# Patient Record
Sex: Female | Born: 1975 | Race: White | Hispanic: No | Smoking: Former smoker
Health system: Southern US, Community
[De-identification: ages and names within clinical notes are randomized; demographics above are authoritative.]

## PROBLEM LIST (undated history)

## (undated) ENCOUNTER — Inpatient Hospital Stay: Payer: Self-pay

## (undated) DIAGNOSIS — M1990 Unspecified osteoarthritis, unspecified site: Secondary | ICD-10-CM

## (undated) DIAGNOSIS — M5136 Other intervertebral disc degeneration, lumbar region: Secondary | ICD-10-CM

## (undated) DIAGNOSIS — E039 Hypothyroidism, unspecified: Secondary | ICD-10-CM

## (undated) DIAGNOSIS — D649 Anemia, unspecified: Secondary | ICD-10-CM

## (undated) DIAGNOSIS — L309 Dermatitis, unspecified: Secondary | ICD-10-CM

## (undated) DIAGNOSIS — M51369 Other intervertebral disc degeneration, lumbar region without mention of lumbar back pain or lower extremity pain: Secondary | ICD-10-CM

## (undated) DIAGNOSIS — M5126 Other intervertebral disc displacement, lumbar region: Secondary | ICD-10-CM

## (undated) DIAGNOSIS — S0990XA Unspecified injury of head, initial encounter: Secondary | ICD-10-CM

## (undated) DIAGNOSIS — M109 Gout, unspecified: Secondary | ICD-10-CM

## (undated) DIAGNOSIS — M069 Rheumatoid arthritis, unspecified: Secondary | ICD-10-CM

## (undated) DIAGNOSIS — Z8781 Personal history of (healed) traumatic fracture: Secondary | ICD-10-CM

## (undated) DIAGNOSIS — F1911 Other psychoactive substance abuse, in remission: Secondary | ICD-10-CM

## (undated) HISTORY — DX: Other intervertebral disc displacement, lumbar region: M51.26

## (undated) HISTORY — DX: Gout, unspecified: M10.9

## (undated) HISTORY — DX: Other intervertebral disc degeneration, lumbar region: M51.36

## (undated) HISTORY — DX: Other psychoactive substance abuse, in remission: F19.11

## (undated) HISTORY — DX: Rheumatoid arthritis, unspecified: M06.9

## (undated) HISTORY — DX: Dermatitis, unspecified: L30.9

## (undated) HISTORY — DX: Hypothyroidism, unspecified: E03.9

## (undated) HISTORY — PX: PELVIC LAPAROSCOPY: SHX162

## (undated) HISTORY — PX: CHOLECYSTECTOMY: SHX55

## (undated) HISTORY — DX: Anemia, unspecified: D64.9

## (undated) HISTORY — DX: Unspecified osteoarthritis, unspecified site: M19.90

## (undated) HISTORY — PX: COSMETIC SURGERY: SHX468

## (undated) HISTORY — DX: Unspecified injury of head, initial encounter: S09.90XA

## (undated) HISTORY — DX: Personal history of (healed) traumatic fracture: Z87.81

## (undated) HISTORY — DX: Other intervertebral disc degeneration, lumbar region without mention of lumbar back pain or lower extremity pain: M51.369

---

## 2005-11-07 ENCOUNTER — Observation Stay: Payer: Self-pay | Admitting: Obstetrics and Gynecology

## 2005-12-24 ENCOUNTER — Observation Stay: Payer: Self-pay | Admitting: Obstetrics and Gynecology

## 2005-12-29 ENCOUNTER — Inpatient Hospital Stay: Payer: Self-pay | Admitting: Obstetrics and Gynecology

## 2006-06-23 ENCOUNTER — Emergency Department: Payer: Self-pay | Admitting: Emergency Medicine

## 2006-06-30 ENCOUNTER — Ambulatory Visit: Payer: Self-pay | Admitting: Obstetrics and Gynecology

## 2006-07-07 ENCOUNTER — Ambulatory Visit: Payer: Self-pay | Admitting: Obstetrics and Gynecology

## 2006-08-04 ENCOUNTER — Encounter: Payer: Self-pay | Admitting: Obstetrics and Gynecology

## 2006-08-19 ENCOUNTER — Encounter: Payer: Self-pay | Admitting: Obstetrics and Gynecology

## 2009-03-30 ENCOUNTER — Emergency Department: Payer: Self-pay | Admitting: Emergency Medicine

## 2010-09-16 ENCOUNTER — Emergency Department: Payer: Self-pay | Admitting: Emergency Medicine

## 2012-02-14 ENCOUNTER — Ambulatory Visit: Payer: Self-pay | Admitting: Internal Medicine

## 2013-03-29 ENCOUNTER — Ambulatory Visit: Payer: Self-pay | Admitting: Physical Medicine and Rehabilitation

## 2014-02-27 ENCOUNTER — Emergency Department: Payer: Self-pay | Admitting: Emergency Medicine

## 2014-02-27 LAB — COMPREHENSIVE METABOLIC PANEL
ALK PHOS: 86 U/L
ALT: 27 U/L
Albumin: 3.4 g/dL (ref 3.4–5.0)
Anion Gap: 10 (ref 7–16)
BILIRUBIN TOTAL: 1 mg/dL (ref 0.2–1.0)
BUN: 12 mg/dL (ref 7–18)
Calcium, Total: 9 mg/dL (ref 8.5–10.1)
Chloride: 102 mmol/L (ref 98–107)
Co2: 22 mmol/L (ref 21–32)
Creatinine: 0.81 mg/dL (ref 0.60–1.30)
EGFR (African American): >60
GLUCOSE: 180 mg/dL — AB (ref 65–99)
Osmolality: 273 (ref 275–301)
Potassium: 4 mmol/L (ref 3.5–5.1)
SGOT(AST): 51 U/L — ABNORMAL HIGH (ref 15–37)
SODIUM: 134 mmol/L — AB (ref 136–145)
Total Protein: 8.1 g/dL (ref 6.4–8.2)

## 2014-02-27 LAB — CBC WITH DIFFERENTIAL/PLATELET
BASOS ABS: 0 10*3/uL (ref 0.0–0.1)
Basophil %: 0.2 %
Eosinophil #: 0 10*3/uL (ref 0.0–0.7)
Eosinophil %: 0.6 %
HCT: 39.3 % (ref 35.0–47.0)
HGB: 13 g/dL (ref 12.0–16.0)
LYMPHS ABS: 0.9 10*3/uL — AB (ref 1.0–3.6)
Lymphocyte %: 14.8 %
MCH: 28.3 pg (ref 26.0–34.0)
MCHC: 33.1 g/dL (ref 32.0–36.0)
MCV: 86 fL (ref 80–100)
MONO ABS: 0.3 x10 3/mm (ref 0.2–0.9)
Monocyte %: 5.7 %
NEUTROS PCT: 78.7 %
Neutrophil #: 4.7 10*3/uL (ref 1.4–6.5)
Platelet: 282 10*3/uL (ref 150–440)
RBC: 4.59 10*6/uL (ref 3.80–5.20)
RDW: 15.5 % — ABNORMAL HIGH (ref 11.5–14.5)
WBC: 5.9 10*3/uL (ref 3.6–11.0)

## 2014-02-27 LAB — DRUG SCREEN, URINE
Amphetamines, Ur Screen: POSITIVE (ref ?–1000)
BARBITURATES, UR SCREEN: NEGATIVE (ref ?–200)
BENZODIAZEPINE, UR SCRN: NEGATIVE (ref ?–200)
COCAINE METABOLITE, UR ~~LOC~~: POSITIVE (ref ?–300)
Cannabinoid 50 Ng, Ur ~~LOC~~: POSITIVE (ref ?–50)
MDMA (ECSTASY) UR SCREEN: NEGATIVE (ref ?–500)
Methadone, Ur Screen: NEGATIVE (ref ?–300)
OPIATE, UR SCREEN: POSITIVE (ref ?–300)
PHENCYCLIDINE (PCP) UR S: NEGATIVE (ref ?–25)
Tricyclic, Ur Screen: NEGATIVE (ref ?–1000)

## 2014-02-27 LAB — RAPID HIV SCREEN (HIV 1/2 AB+AG)

## 2015-06-21 NOTE — L&D Delivery Note (Signed)
VAGINAL DELIVERY NOTE:  Date of Delivery: 09/29/2015 Primary OB:[facility] Gestational Age/EDD: [redacted]w[redacted]d 09/25/2015 Antepartum complications: Poor attendance at maternal visits (missed multiple visits) Attending Physician: Hal Neer, CNM Delivery Type: NSVD of viable female  Anesthesia: None Laceration: None Episiotomy:None Placenta: mec stained amniotic fluid Intrapartum complications: PP hemorrhage 500 mls'  Estimated Blood Loss: 500 ml's  GBS:neg Procedure Details: CTSP due to being complete and waited for FOB Misty Stanley) as pt was feeling urge to push. Misty Stanley called and arrived quickly and pt pushed x 3 with FHR to 70's x 2 mins and mec noted (thick non-particulate mec brown) noted. Vtx del with CAN  X 1 reduced and ant and post shoulder and body del at 0935pm. CCx2 and cut after pulsation stopped. Cord blood not needed as pt is A pos. SDOP intact and no lacs seen. Called back to room within 5 mins for a pp hemorrhage. Pitocin in IV  Bag running wide open. Cytotec 800 mg per rectum placed. Bleeding controlled. VSS. Sponge and needle count correct. Bonding with baby skin to skin.    APGAR: 8, 9; weight  .    Sharee Pimple 09/29/2015, 10:43 PM

## 2015-07-17 LAB — HM PAP SMEAR

## 2015-07-23 ENCOUNTER — Ambulatory Visit: Payer: Self-pay

## 2015-08-11 LAB — HM HIV SCREENING LAB: HM HIV Screening: NEGATIVE

## 2015-09-29 ENCOUNTER — Inpatient Hospital Stay
Admission: EM | Admit: 2015-09-29 | Discharge: 2015-10-01 | DRG: 774 | Disposition: A | Discharge status: Home / Self Care | Payer: BLUE CROSS/BLUE SHIELD | Attending: Obstetrics and Gynecology | Admitting: Obstetrics and Gynecology

## 2015-09-29 ENCOUNTER — Encounter: Payer: Self-pay | Admitting: *Deleted

## 2015-09-29 DIAGNOSIS — Z23 Encounter for immunization: Secondary | ICD-10-CM | POA: Diagnosis not present

## 2015-09-29 DIAGNOSIS — Z3A40 40 weeks gestation of pregnancy: Secondary | ICD-10-CM | POA: Diagnosis not present

## 2015-09-29 DIAGNOSIS — Z87891 Personal history of nicotine dependence: Secondary | ICD-10-CM

## 2015-09-29 HISTORY — DX: Unspecified osteoarthritis, unspecified site: M19.90

## 2015-09-29 LAB — URINE DRUG SCREEN, QUALITATIVE (ARMC ONLY)
AMPHETAMINES, UR SCREEN: NOT DETECTED
BENZODIAZEPINE, UR SCRN: NOT DETECTED
Barbiturates, Ur Screen: NOT DETECTED
Cannabinoid 50 Ng, Ur ~~LOC~~: POSITIVE — AB
Cocaine Metabolite,Ur ~~LOC~~: POSITIVE — AB
MDMA (Ecstasy)Ur Screen: NOT DETECTED
METHADONE SCREEN, URINE: NOT DETECTED
OPIATE, UR SCREEN: NOT DETECTED
Phencyclidine (PCP) Ur S: NOT DETECTED
Tricyclic, Ur Screen: NOT DETECTED

## 2015-09-29 LAB — CBC
HEMATOCRIT: 37.1 % (ref 35.0–47.0)
HEMOGLOBIN: 12.9 g/dL (ref 12.0–16.0)
MCH: 29.2 pg (ref 26.0–34.0)
MCHC: 34.8 g/dL (ref 32.0–36.0)
MCV: 83.9 fL (ref 80.0–100.0)
Platelets: 159 10*3/uL (ref 150–440)
RBC: 4.42 MIL/uL (ref 3.80–5.20)
RDW: 13.5 % (ref 11.5–14.5)
WBC: 10.6 10*3/uL (ref 3.6–11.0)

## 2015-09-29 LAB — TYPE AND SCREEN
ABO/RH(D): A POS
Antibody Screen: NEGATIVE

## 2015-09-29 MED ORDER — BUTORPHANOL TARTRATE 1 MG/ML IJ SOLN: 2.0000 mg | Freq: Once | INTRAMUSCULAR | Status: DC

## 2015-09-29 MED ORDER — OXYTOCIN 40 UNITS IN LACTATED RINGERS INFUSION - SIMPLE MED
2.5000 [IU]/h | INTRAVENOUS | Status: DC
  Administered 2015-09-29: 20 [IU]/h via INTRAVENOUS
  Filled 2015-09-29: qty 1000

## 2015-09-29 MED ORDER — BUTORPHANOL TARTRATE 1 MG/ML IJ SOLN
1.0000 mg | Freq: Once | INTRAMUSCULAR | Status: AC
  Administered 2015-09-29: 1 mg via INTRAVENOUS
  Administered 2015-09-29: 2 mg via INTRAVENOUS
  Filled 2015-09-29: qty 1

## 2015-09-29 MED ORDER — CITRIC ACID-SODIUM CITRATE 334-500 MG/5ML PO SOLN: 30.0000 mL | ORAL | Status: DC | PRN

## 2015-09-29 MED ORDER — ACETAMINOPHEN 325 MG PO TABS: 650.0000 mg | ORAL_TABLET | ORAL | Status: DC | PRN

## 2015-09-29 MED ORDER — LIDOCAINE HCL (PF) 1 % IJ SOLN
30.0000 mL | INTRAMUSCULAR | Status: DC | PRN
  Filled 2015-09-29 (×2): qty 30

## 2015-09-29 MED ORDER — MISOPROSTOL 200 MCG PO TABS
ORAL_TABLET | ORAL | Status: AC
  Administered 2015-09-29: 800 ug via RECTAL
  Filled 2015-09-29: qty 4

## 2015-09-29 MED ORDER — BUTORPHANOL TARTRATE 1 MG/ML IJ SOLN: 1.0000 mg | INTRAMUSCULAR | Status: DC | PRN

## 2015-09-29 MED ORDER — ONDANSETRON HCL 4 MG/2ML IJ SOLN: 4.0000 mg | Freq: Four times a day (QID) | INTRAMUSCULAR | Status: DC | PRN

## 2015-09-29 MED ORDER — LACTATED RINGERS IV SOLN
INTRAVENOUS | Status: DC
  Administered 2015-09-29: 18:00:00 via INTRAVENOUS

## 2015-09-29 MED ORDER — OXYTOCIN 10 UNIT/ML IJ SOLN
INTRAMUSCULAR | Status: AC
  Filled 2015-09-29: qty 4

## 2015-09-29 MED ORDER — BUTORPHANOL TARTRATE 1 MG/ML IJ SOLN
INTRAMUSCULAR | Status: AC
  Administered 2015-09-29: 2 mg via INTRAVENOUS
  Filled 2015-09-29: qty 2

## 2015-09-29 MED ORDER — LACTATED RINGERS IV SOLN
500.0000 mL | INTRAVENOUS | Status: DC | PRN
  Administered 2015-09-29: 1000 mL via INTRAVENOUS

## 2015-09-29 MED ORDER — AMMONIA AROMATIC IN INHA
RESPIRATORY_TRACT | Status: AC
  Filled 2015-09-29: qty 10

## 2015-09-29 MED ORDER — OXYTOCIN BOLUS FROM INFUSION: 500.0000 mL | INTRAVENOUS | Status: DC

## 2015-09-29 NOTE — Progress Notes (Signed)
S:Pt is writhing in pain. + CTX, no LOF, + scant VB O: Filed Vitals:   09/29/15 1701 09/29/15 1844 09/29/15 1924 09/29/15 1949  BP: 124/67   142/78  Pulse: 94   76  Temp: 98.4 F (36.9 C)  97.8 F (36.6 C)   TempSrc: Oral  Oral   Resp: 22   18  Height:  5\' 9"  (1.753 m)    Weight:  197 lb (89.359 kg)      Gen: NAD, AAOx3      Abd: FNTTP      Ext: Non-tender, Nonedmeatous    FHT: + mod var + accelerations, variable decelerations to 80-90 with 10-15 secs with recovery, Cat 2 TOCO: Q 3-4  min SVE: 6/100/vtx   A/P:  40 y.o. yo G1P0 at [redacted]w[redacted]d for labor.   Labor: active   FWB: Reassuring Cat 2  tracing. EFW 6#1oz  GBS: neg    [redacted]w[redacted]d 8:39 PM

## 2015-09-29 NOTE — H&P (Signed)
HISTORY AND PHYSICAL  HISTORY OF PRESENT ILLNESS: Ms. Kathy Walton is a 40 y.o. [phone] (1 ectopic, 2 SAB) and at [redacted]w[redacted]d by LMP of 12/19/14 & EDD of 09/25/15  consistent with  Anatomy  ultrasound with a pregnancy complicated by previous presenting for labor symptoms.   She has been having contractions Q 5 minsand denies leakage of fluid, vaginal bleeding, or decreased fetal movement.    REVIEW OF SYSTEMS: A complete review of systems was performed and was specifically negative for headache, changes in vision, RUQ pain, shortness of breath, chest pain, lower extremity edema and dysuria.   HISTORY:  Past Medical History  Diagnosis Date  . Arthritis   Substance Abuse in the past labs 2015  Past Surgical History  Procedure Laterality Date  . Cholecystectomy    Ectopic pregnancy  No current facility-administered medications on file prior to encounter.   No current outpatient prescriptions on file prior to encounter.     Not on File  OB History  Gravida Para Term Preterm AB SAB TAB Ectopic Multiple Living  1             # Outcome Date GA Lbr Len/2nd Weight Sex Delivery Anes PTL Lv  1 Current               Gynecologic History: History of Abnormal Pap Smear: unknown History of STI: Trich  Social History  Substance Use Topics  . Smoking status: Former Smoker    Types: Cigarettes    Quit date: 09/01/2015  . Smokeless tobacco: None  . Alcohol Use: No    PHYSICAL EXAM:    GENERAL: NAD AAOx3 CHEST:CTAB no increased work of breathing. CTA bilat, no W/R/R.  CV:RRR no appreciable murmurs, rubs, gallops ABDOMEN: gravid, nontender, EFW 6#1oz by [place] EXTREMITIES:  Warm and well-perfused, nontender, nonedematous, 1+ DTRs 0 clonus CERVIX: 3/90/vtx-1   FHT:s 140 baseline wit minimal variability +accelerations 10 x 10 x 2 and  No  Decelerations (maternal pulse showing)  Toco: irregular on monitor.   DIAGNOSTIC STUDIES: No results for input(s): WBC, HGB, HCT, PLT, NA, K, CL, CO2,  BUN, CREATININE, LABGLOM, GLUCOSE, CALCIUM, BILIDIR, ALKPHOS, AST, ALT, PROT, MG in the last 168 hours.  Invalid input(s): LABALB, UA  PRENATAL STUDIES:  Prenatal Labs:  MBT  pos; Rubella immune, Varicella immune, HIV neg, RPR neg, Hep B neg, GC/CT neg, GBS neg , glucola SNL  Last [place] Could not find but, pt stated she had a normal anatomy US  ASSESSMENT AND PLAN:  1. Fetal Well being  - Fetal Tracing: reactive with 2 10 x 10 accels only  - Ultrasound:could not find in chart - Group B Streptococcus: neg - Presentation vtx confirmed by RN -AMA and pt missed many appts in OB care  2. Routine OB: - Prenatal labs reviewed, as above - Rh  A pos  3.  Labor:  -  Contractionsexternal toco in place -  Pelvis proven to 8#4oz -  Plan: Ext fetal and uterine monitors  4. Post Partum Planning: - Infant feeding:  - Contraception:   5> Substance Abuse hx 7 years drug free but, did admit to Cocaine 4 days ago due to being stressed out.

## 2015-09-29 NOTE — Progress Notes (Signed)
Dr Bernestine Amass texted with update. Will continue to push but, pt is becoming exhausted.    Sharee Pimple 7:50 PM

## 2015-09-30 ENCOUNTER — Encounter: Payer: Self-pay | Admitting: *Deleted

## 2015-09-30 LAB — ABO/RH: ABO/RH(D): A POS

## 2015-09-30 LAB — CBC
HCT: 35.2 % (ref 35.0–47.0)
Hemoglobin: 12.2 g/dL (ref 12.0–16.0)
MCH: 29.1 pg (ref 26.0–34.0)
MCHC: 34.6 g/dL (ref 32.0–36.0)
MCV: 84.1 fL (ref 80.0–100.0)
PLATELETS: 159 10*3/uL (ref 150–440)
RBC: 4.19 MIL/uL (ref 3.80–5.20)
RDW: 13.7 % (ref 11.5–14.5)
WBC: 12.3 10*3/uL — ABNORMAL HIGH (ref 3.6–11.0)

## 2015-09-30 MED ORDER — LACTATED RINGERS IV SOLN: INTRAVENOUS | Status: DC

## 2015-09-30 MED ORDER — DIBUCAINE 1 % RE OINT: 1.0000 "application " | TOPICAL_OINTMENT | RECTAL | Status: DC | PRN

## 2015-09-30 MED ORDER — OXYCODONE HCL 5 MG PO TABS
10.0000 mg | ORAL_TABLET | ORAL | Status: DC | PRN
  Administered 2015-09-30: 10 mg via ORAL
  Filled 2015-09-30: qty 2

## 2015-09-30 MED ORDER — TETANUS-DIPHTH-ACELL PERTUSSIS 5-2.5-18.5 LF-MCG/0.5 IM SUSP: 0.5000 mL | INTRAMUSCULAR | Status: DC | PRN

## 2015-09-30 MED ORDER — LANOLIN HYDROUS EX OINT: TOPICAL_OINTMENT | CUTANEOUS | Status: DC | PRN

## 2015-09-30 MED ORDER — MEASLES, MUMPS & RUBELLA VAC ~~LOC~~ INJ: 0.5000 mL | INJECTION | SUBCUTANEOUS | Status: DC | PRN

## 2015-09-30 MED ORDER — ZOLPIDEM TARTRATE 5 MG PO TABS: 5.0000 mg | ORAL_TABLET | Freq: Every evening | ORAL | Status: DC | PRN

## 2015-09-30 MED ORDER — DIPHENHYDRAMINE HCL 25 MG PO CAPS: 25.0000 mg | ORAL_CAPSULE | Freq: Four times a day (QID) | ORAL | Status: DC | PRN

## 2015-09-30 MED ORDER — LACTATED RINGERS IV SOLN: 500.0000 mL | INTRAVENOUS | Status: DC | PRN

## 2015-09-30 MED ORDER — SIMETHICONE 80 MG PO CHEW: 80.0000 mg | CHEWABLE_TABLET | ORAL | Status: DC | PRN

## 2015-09-30 MED ORDER — IBUPROFEN 600 MG PO TABS
600.0000 mg | ORAL_TABLET | Freq: Four times a day (QID) | ORAL | Status: DC
  Administered 2015-09-30 – 2015-10-01 (×5): 600 mg via ORAL
  Filled 2015-09-30 (×5): qty 1

## 2015-09-30 MED ORDER — SODIUM CHLORIDE 0.9% FLUSH: 3.0000 mL | Freq: Two times a day (BID) | INTRAVENOUS | Status: DC

## 2015-09-30 MED ORDER — ACETAMINOPHEN 325 MG PO TABS: 650.0000 mg | ORAL_TABLET | ORAL | Status: DC | PRN

## 2015-09-30 MED ORDER — BENZOCAINE-MENTHOL 20-0.5 % EX AERO: 1.0000 "application " | INHALATION_SPRAY | CUTANEOUS | Status: DC | PRN

## 2015-09-30 MED ORDER — SODIUM CHLORIDE 0.9% FLUSH: 3.0000 mL | INTRAVENOUS | Status: DC | PRN

## 2015-09-30 MED ORDER — SENNOSIDES-DOCUSATE SODIUM 8.6-50 MG PO TABS
2.0000 | ORAL_TABLET | ORAL | Status: DC
  Administered 2015-09-30 – 2015-10-01 (×2): 2 via ORAL
  Filled 2015-09-30 (×2): qty 2

## 2015-09-30 MED ORDER — BUTORPHANOL TARTRATE 1 MG/ML IJ SOLN: 1.0000 mg | INTRAMUSCULAR | Status: DC | PRN

## 2015-09-30 MED ORDER — OXYCODONE HCL 5 MG PO TABS
5.0000 mg | ORAL_TABLET | ORAL | Status: DC | PRN
  Administered 2015-09-30 – 2015-10-01 (×6): 5 mg via ORAL
  Filled 2015-09-30 (×6): qty 1

## 2015-09-30 MED ORDER — FLEET ENEMA 7-19 GM/118ML RE ENEM: 1.0000 | ENEMA | Freq: Every day | RECTAL | Status: DC | PRN

## 2015-09-30 MED ORDER — SODIUM CHLORIDE 0.9 % IV SOLN: 250.0000 mL | INTRAVENOUS | Status: DC | PRN

## 2015-09-30 MED ORDER — WITCH HAZEL-GLYCERIN EX PADS: 1.0000 "application " | MEDICATED_PAD | CUTANEOUS | Status: DC | PRN

## 2015-09-30 MED ORDER — BISACODYL 10 MG RE SUPP: 10.0000 mg | Freq: Every day | RECTAL | Status: DC | PRN

## 2015-09-30 MED ORDER — ONDANSETRON HCL 4 MG PO TABS: 4.0000 mg | ORAL_TABLET | ORAL | Status: DC | PRN

## 2015-09-30 MED ORDER — LACTATED RINGERS IV SOLN
INTRAVENOUS | Status: DC
  Administered 2015-09-30: 06:00:00 via INTRAVENOUS

## 2015-09-30 MED ORDER — PRENATAL MULTIVITAMIN CH
1.0000 | ORAL_TABLET | Freq: Every day | ORAL | Status: DC
  Administered 2015-09-30: 1 via ORAL
  Filled 2015-09-30: qty 1

## 2015-09-30 MED ORDER — ONDANSETRON HCL 4 MG/2ML IJ SOLN: 4.0000 mg | INTRAMUSCULAR | Status: DC | PRN

## 2015-09-30 NOTE — Discharge Summary (Signed)
Obstetric Discharge Summary   Patient ID: Kathy Walton MRN: [id number] DOB/AGE: 1975/12/04 40 y.o.   Date of Admission: 09/29/2015  Date of Discharge: 10/01/15  Admitting Diagnosis:Term pregnancy at [redacted]w[redacted]d  Secondary Diagnosis:Cocaine Usage   Mode of Delivery: NSVD of viable female Discharge Diagnosis: NSVD with intact perineum  Post partum procedures: None  Complications:None   Brief Hospital Course  Kathy Walton is a G1P1001 who had a SVD on  09/29/15;  for further details of this delivery, please refer to the delivery note.  Patient had an uncomplicated postpartum course.  By time of discharge on PPD#2, her pain was controlled on oral pain medications; she had appropriate lochia and was ambulating, voiding without difficulty and tolerating regular diet.  She was deemed stable for discharge to home.     Labs: CBC Latest Ref Rng 09/29/2015 02/27/2014  WBC 3.6 - 11.0 K/uL 10.6 5.9  Hemoglobin 12.0 - 16.0 g/dL 81.1 03.1  Hematocrit 59.4 - 47.0 % 37.1 39.3  Platelets 150 - 440 K/uL 159 282   A POS  Physical exam:  Blood pressure 149/81, pulse 80, temperature 98.2 F (36.8 C), temperature source Oral, resp. rate 18, height 5\' 9"  (1.753 m), weight 197 lb (89.359 kg), unknown if currently breastfeeding. General: alert and no distress Lochia: appropriate Abdomen: soft, NT Uterine Fundus: firm  Extremities: No evidence of DVT seen on physical exam. No lower extremity edema.  Discharge Instructions: Per After Visit Summary. Activity: Advance as tolerated. Pelvic rest for 6 weeks.  Also refer to After Visit Summary Diet: Regular Medications:   Medication List    ASK your doctor about these medications        multivitamin-prenatal 27-0.8 MG Tabs tablet  Take 1 tablet by mouth daily at 12 noon.       Outpatient follow up:      Follow-up Information    Follow up In 6 weeks.     Postpartum contraception: unsure  Discharged Condition: Stable  Discharged to:  Home   Newborn Data:  Baby Boy   Disposition:Home with mom  Apgars: APGAR (1 MIN): 8   APGAR (5 MINS): 9   APGAR (10 MINS):    Baby Feeding: Bottle  , CNM 09/30/2015

## 2015-09-30 NOTE — Progress Notes (Addendum)
Post Partum Day 1 Subjective: no complaints, up ad lib, voiding and tolerating PO States both shoulders are sore today from pushing Objective: Blood pressure 141/66, pulse 86, temperature 98.8 F (37.1 C), temperature source Oral, resp. rate 20, height 5\' 9"  (1.753 m), weight 197 lb (89.359 kg), SpO2 98 %, unknown if currently breastfeeding.  Physical Exam:  General: alert, cooperative and no distress  HEART: S1S2,RRR, NO M/R/G. LUNGS: CTA bilat, no W/R/R. Lochia: appropriate Uterine Fundus: firm, U-2 Perineum: intact DVT Evaluation: No evidence of DVT seen on physical exam.   Recent Labs  09/29/15 1744 09/30/15 0609  HGB 12.9 12.2  HCT 37.1 35.2  WBC 10.6 12.3*  PLT 159 159    Assessment/Plan: Plan for discharge tomorrow   LOS: 1 day   11/30/15 09/30/2015, 9:45 AM

## 2015-09-30 NOTE — Discharge Instructions (Signed)

## 2015-09-30 NOTE — Clinical Social Work Maternal (Signed)
  CLINICAL SOCIAL WORK MATERNAL/CHILD NOTE  Patient Details  Name: Kathy Walton MRN: [id number] Date of Birth: Nov 06, 1975  Date:  09/30/2015  Clinical Social Worker Initiating Note:  Darden Dates, MSW, LCSW Date/ Time Initiated:  09/30/15/1030     Child's Name:    Kathy Walton (baby boy)  Legal Guardian:  Mother   Need for Interpreter:  None   Date of Referral:  09/30/15     Reason for Referral:  Current Substance Use/Substance Use During Pregnancy    Referral Source:  [facility]   Address:  [address], [place], [place]   Phone number:  [phone]   Household Members:  Self and baby  Natural Supports (not living in the home):  Children, Parent, Spouse/significant other, Extended Family   Professional Supports: Organized support group (Comment) ([place])   Employment: Unemployed   Type of Work: Chartered loss adjuster   Education:  [employer] school Herbalist Resources:  Multimedia programmer   Other Resources:  Other (Comment)   Cultural/Religious Considerations Which May Impact Care: None identified.   Strengths:  Ability to meet basic needs , Home prepared for child , Understanding of illness   Risk Factors/Current Problems:  Substance Use    Cognitive State:  Alert    Mood/Affect:  Calm    CSW Assessment: CSW met with pt to address consult. CSW was consulted due positive drug screen. Pt tested positive for cocaine and THC at delivery. CSW introduced herself and explained role of social work. CSW also explained the nature of the consult. Pt lives alone and is currently not working. Pt reported that she will be taking 6 weeks and then returning to work for the [employer]. Pt is a mother of 3 children, however she does not have custody of her two oldest. Pt reported that her children's father had a "falling out" and she gave custody of her daughter to her father and gave custody of her son to her cousin. This was her choice for  medical reasons as she was incarcerated for 3 months due to a misdemeanor. Pt reported that she has been sober for 7 years, however "lapsed" 4 days ago when she did cocaine and marijuana. Pt shared that she was very stressed out because her 26 year old daughter dropped out of school. Pt shared that she needs to learn to "talk things out". 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 Pt reported that she immediatly called her group due to a "very bad decision." Pt stated that will not "beat herself up over this" and move forward. She is grateful that it has nothing terrible happened. Baby's tox screen is pending, however due to pt being positive for cocaine, a CPS report will be made. Pt denies any previous CPS involvement. CSW informed pt of this, and she is agreeable. CSW provided substance abuse resources. Pt will discharge home at discharge with baby. CSW is signing off as no further needs identified.   CSW Plan/Description:  Child Protective Service Report     Darden Dates, LCSW 09/30/2015, 11:01 AM

## 2015-10-01 LAB — SURGICAL PATHOLOGY

## 2015-10-01 LAB — RPR: RPR Ser Ql: NONREACTIVE

## 2015-10-01 NOTE — Progress Notes (Signed)
Pt discharged home with infant.  Discharge instructions and follow up appointment given to and reviewed with pt.  Pt verbalized understanding.  Escorted by auxillary. 

## 2016-04-11 ENCOUNTER — Other Ambulatory Visit: Payer: Self-pay | Admitting: Family

## 2016-04-11 DIAGNOSIS — M5416 Radiculopathy, lumbar region: Secondary | ICD-10-CM

## 2016-04-21 ENCOUNTER — Ambulatory Visit: Payer: BLUE CROSS/BLUE SHIELD

## 2016-11-01 ENCOUNTER — Ambulatory Visit (INDEPENDENT_AMBULATORY_CARE_PROVIDER_SITE_OTHER): Payer: BLUE CROSS/BLUE SHIELD | Admitting: Physician Assistant

## 2016-11-01 ENCOUNTER — Encounter: Payer: Self-pay | Admitting: Physician Assistant

## 2016-11-01 VITALS — BP 124/80 | HR 100 | Temp 98.4°F | Resp 16 | Ht 68.0 in | Wt 202.0 lb

## 2016-11-01 DIAGNOSIS — R590 Localized enlarged lymph nodes: Secondary | ICD-10-CM | POA: Diagnosis not present

## 2016-11-01 DIAGNOSIS — R569 Unspecified convulsions: Secondary | ICD-10-CM | POA: Diagnosis not present

## 2016-11-01 DIAGNOSIS — F419 Anxiety disorder, unspecified: Secondary | ICD-10-CM

## 2016-11-01 DIAGNOSIS — Z7689 Persons encountering health services in other specified circumstances: Secondary | ICD-10-CM | POA: Diagnosis not present

## 2016-11-01 DIAGNOSIS — F1911 Other psychoactive substance abuse, in remission: Secondary | ICD-10-CM

## 2016-11-01 DIAGNOSIS — Z87898 Personal history of other specified conditions: Secondary | ICD-10-CM | POA: Diagnosis not present

## 2016-11-01 DIAGNOSIS — M25561 Pain in right knee: Secondary | ICD-10-CM | POA: Diagnosis not present

## 2016-11-01 DIAGNOSIS — N949 Unspecified condition associated with female genital organs and menstrual cycle: Secondary | ICD-10-CM | POA: Diagnosis not present

## 2016-11-01 DIAGNOSIS — M25562 Pain in left knee: Secondary | ICD-10-CM

## 2016-11-01 MED ORDER — MELOXICAM 7.5 MG PO TABS: ORAL_TABLET | ORAL | 0 refills | Status: DC
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
## 2016-11-03 ENCOUNTER — Encounter: Payer: Self-pay | Admitting: Physician Assistant

## 2016-11-03 ENCOUNTER — Telehealth: Payer: Self-pay | Admitting: Physician Assistant

## 2016-11-03 DIAGNOSIS — E079 Disorder of thyroid, unspecified: Secondary | ICD-10-CM

## 2016-11-03 DIAGNOSIS — N949 Unspecified condition associated with female genital organs and menstrual cycle: Secondary | ICD-10-CM

## 2016-11-03 DIAGNOSIS — M25561 Pain in right knee: Secondary | ICD-10-CM | POA: Insufficient documentation

## 2016-11-03 DIAGNOSIS — F1911 Other psychoactive substance abuse, in remission: Secondary | ICD-10-CM | POA: Insufficient documentation

## 2016-11-03 DIAGNOSIS — Z13220 Encounter for screening for lipoid disorders: Secondary | ICD-10-CM

## 2016-11-03 DIAGNOSIS — R569 Unspecified convulsions: Secondary | ICD-10-CM | POA: Insufficient documentation

## 2016-11-03 DIAGNOSIS — R590 Localized enlarged lymph nodes: Secondary | ICD-10-CM | POA: Insufficient documentation

## 2016-11-03 DIAGNOSIS — E0789 Other specified disorders of thyroid: Secondary | ICD-10-CM

## 2016-11-03 DIAGNOSIS — M25562 Pain in left knee: Secondary | ICD-10-CM

## 2016-11-03 DIAGNOSIS — F419 Anxiety disorder, unspecified: Secondary | ICD-10-CM | POA: Insufficient documentation

## 2016-11-03 NOTE — Patient Instructions (Signed)
Health Maintenance, Female Adopting a healthy lifestyle and getting preventive care can go a long way to promote health and wellness. Talk with your health care provider about what schedule of regular examinations is right for you. This is a good chance for you to check in with your provider about disease prevention and staying healthy. In between checkups, there are plenty of things you can do on your own. Experts have done a lot of research about which lifestyle changes and preventive measures are most likely to keep you healthy. Ask your health care provider for more information. Weight and diet Eat a healthy diet  Be sure to include plenty of vegetables, fruits, low-fat dairy products, and lean protein.  Do not eat a lot of foods high in solid fats, added sugars, or salt.  Get regular exercise. This is one of the most important things you can do for your health.  Most adults should exercise for at least 150 minutes each week. The exercise should increase your heart rate and make you sweat (moderate-intensity exercise).  Most adults should also do strengthening exercises at least twice a week. This is in addition to the moderate-intensity exercise. Maintain a healthy weight  Body mass index (BMI) is a measurement that can be used to identify possible weight problems. It estimates body fat based on height and weight. Your health care provider can help determine your BMI and help you achieve or maintain a healthy weight.  For females 108 years of age and older:  A BMI below 18.5 is considered underweight.  A BMI of 18.5 to 24.9 is normal.  A BMI of 25 to 29.9 is considered overweight.  A BMI of 30 and above is considered obese. Watch levels of cholesterol and blood lipids  You should start having your blood tested for lipids and cholesterol at 41 years of age, then have this test every 5 years.  You may need to have your cholesterol levels checked more often if:  Your lipid or  cholesterol levels are high.  You are older than 40 years of age.  You are at high risk for heart disease. Cancer screening Lung Cancer  Lung cancer screening is recommended for adults 72-20 years old who are at high risk for lung cancer because of a history of smoking.  A yearly low-dose CT scan of the lungs is recommended for people who:  Currently smoke.  Have quit within the past 15 years.  Have at least a 30-pack-year history of smoking. A pack year is smoking an average of one pack of cigarettes a day for 1 year.  Yearly screening should continue until it has been 15 years since you quit.  Yearly screening should stop if you develop a health problem that would prevent you from having lung cancer treatment. Breast Cancer  Practice breast self-awareness. This means understanding how your breasts normally appear and feel.  It also means doing regular breast self-exams. Let your health care provider know about any changes, no matter how small.  If you are in your 20s or 30s, you should have a clinical breast exam (CBE) by a health care provider every 1-3 years as part of a regular health exam.  If you are 54 or older, have a CBE every year. Also consider having a breast X-ray (mammogram) every year.  If you have a family history of breast cancer, talk to your health care provider about genetic screening.  If you are at high risk for breast cancer, talk  to your health care provider about having an MRI and a mammogram every year.  Breast cancer gene (BRCA) assessment is recommended for women who have family members with BRCA-related cancers. BRCA-related cancers include:  Breast.  Ovarian.  Tubal.  Peritoneal cancers.  Results of the assessment will determine the need for genetic counseling and BRCA1 and BRCA2 testing. Cervical Cancer  Your health care provider may recommend that you be screened regularly for cancer of the pelvic organs (ovaries, uterus, and vagina).  This screening involves a pelvic examination, including checking for microscopic changes to the surface of your cervix (Pap test). You may be encouraged to have this screening done every 3 years, beginning at age 33.  For women ages 82-65, health care providers may recommend pelvic exams and Pap testing every 3 years, or they may recommend the Pap and pelvic exam, combined with testing for human papilloma virus (HPV), every 5 years. Some types of HPV increase your risk of cervical cancer. Testing for HPV may also be done on women of any age with unclear Pap test results.  Other health care providers may not recommend any screening for nonpregnant women who are considered low risk for pelvic cancer and who do not have symptoms. Ask your health care provider if a screening pelvic exam is right for you.  If you have had past treatment for cervical cancer or a condition that could lead to cancer, you need Pap tests and screening for cancer for at least 20 years after your treatment. If Pap tests have been discontinued, your risk factors (such as having a new sexual partner) need to be reassessed to determine if screening should resume. Some women have medical problems that increase the chance of getting cervical cancer. In these cases, your health care provider may recommend more frequent screening and Pap tests. Colorectal Cancer  This type of cancer can be detected and often prevented.  Routine colorectal cancer screening usually begins at 40 years of age and continues through 39 years of age.  Your health care provider may recommend screening at an earlier age if you have risk factors for colon cancer.  Your health care provider may also recommend using home test kits to check for hidden blood in the stool.  A small camera at the end of a tube can be used to examine your colon directly (sigmoidoscopy or colonoscopy). This is done to check for the earliest forms of colorectal cancer.  Routine  screening usually begins at age 67.  Direct examination of the colon should be repeated every 5-10 years through 40 years of age. However, you may need to be screened more often if early forms of precancerous polyps or small growths are found. Skin Cancer  Check your skin from head to toe regularly.  Tell your health care provider about any new moles or changes in moles, especially if there is a change in a mole's shape or color.  Also tell your health care provider if you have a mole that is larger than the size of a pencil eraser.  Always use sunscreen. Apply sunscreen liberally and repeatedly throughout the day.  Protect yourself by wearing long sleeves, pants, a wide-brimmed hat, and sunglasses whenever you are outside. Heart disease, diabetes, and high blood pressure  High blood pressure causes heart disease and increases the risk of stroke. High blood pressure is more likely to develop in:  People who have blood pressure in the high end of the normal range (130-139/85-89 mm Hg).  People who are overweight or obese.  People who are African American.  If you are 59-24 years of age, have your blood pressure checked every 3-5 years. If you are 34 years of age or older, have your blood pressure checked every year. You should have your blood pressure measured twice-once when you are at a hospital or clinic, and once when you are not at a hospital or clinic. Record the average of the two measurements. To check your blood pressure when you are not at a hospital or clinic, you can use:  An automated blood pressure machine at a pharmacy.  A home blood pressure monitor.  If you are between 29 years and 60 years old, ask your health care provider if you should take aspirin to prevent strokes.  Have regular diabetes screenings. This involves taking a blood sample to check your fasting blood sugar level.  If you are at a normal weight and have a low risk for diabetes, have this test once  every three years after 40 years of age.  If you are overweight and have a high risk for diabetes, consider being tested at a younger age or more often. Preventing infection Hepatitis B  If you have a higher risk for hepatitis B, you should be screened for this virus. You are considered at high risk for hepatitis B if:  You were born in a country where hepatitis B is common. Ask your health care provider which countries are considered high risk.  Your parents were born in a high-risk country, and you have not been immunized against hepatitis B (hepatitis B vaccine).  You have HIV or AIDS.  You use needles to inject street drugs.  You live with someone who has hepatitis B.  You have had sex with someone who has hepatitis B.  You get hemodialysis treatment.  You take certain medicines for conditions, including cancer, organ transplantation, and autoimmune conditions. Hepatitis C  Blood testing is recommended for:  Everyone born from [birth year] through 1964.  Anyone with known risk factors for hepatitis C. Sexually transmitted infections (STIs)  You should be screened for sexually transmitted infections (STIs) including gonorrhea and chlamydia if:  You are sexually active and are younger than 40 years of age.  You are older than 40 years of age and your health care provider tells you that you are at risk for this type of infection.  Your sexual activity has changed since you were last screened and you are at an increased risk for chlamydia or gonorrhea. Ask your health care provider if you are at risk.  If you do not have HIV, but are at risk, it may be recommended that you take a prescription medicine daily to prevent HIV infection. This is called pre-exposure prophylaxis (PrEP). You are considered at risk if:  You are sexually active and do not regularly use condoms or know the HIV status of your partner(s).  You take drugs by injection.  You are sexually active with a partner  who has HIV. Talk with your health care provider about whether you are at high risk of being infected with HIV. If you choose to begin PrEP, you should first be tested for HIV. You should then be tested every 3 months for as long as you are taking PrEP. Pregnancy  If you are premenopausal and you may become pregnant, ask your health care provider about preconception counseling.  If you may become pregnant, take 400 to 800 micrograms (mcg) of folic acid  every day.  If you want to prevent pregnancy, talk to your health care provider about birth control (contraception). Osteoporosis and menopause  Osteoporosis is a disease in which the bones lose minerals and strength with aging. This can result in serious bone fractures. Your risk for osteoporosis can be identified using a bone density scan.  If you are 39 years of age or older, or if you are at risk for osteoporosis and fractures, ask your health care provider if you should be screened.  Ask your health care provider whether you should take a calcium or vitamin D supplement to lower your risk for osteoporosis.  Menopause may have certain physical symptoms and risks.  Hormone replacement therapy may reduce some of these symptoms and risks. Talk to your health care provider about whether hormone replacement therapy is right for you. Follow these instructions at home:  Schedule regular health, dental, and eye exams.  Stay current with your immunizations.  Do not use any tobacco products including cigarettes, chewing tobacco, or electronic cigarettes.  If you are pregnant, do not drink alcohol.  If you are breastfeeding, limit how much and how often you drink alcohol.  Limit alcohol intake to no more than 1 drink per day for nonpregnant women. One drink equals 12 ounces of beer, 5 ounces of wine, or 1 ounces of hard liquor.  Do not use street drugs.  Do not share needles.  Ask your health care provider for help if you need support  or information about quitting drugs.  Tell your health care provider if you often feel depressed.  Tell your health care provider if you have ever been abused or do not feel safe at home. This information is not intended to replace advice given to you by your health care provider. Make sure you discuss any questions you have with your health care provider. Document Released: 12/20/2010 Document Revised: 11/12/2015 Document Reviewed: 03/10/2015 Elsevier Interactive Patient Education  2017 [employer].
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
## 2016-11-03 NOTE — Telephone Encounter (Signed)
Ordered labwork and imaging. She can go to [facility] to get this.

## 2016-11-03 NOTE — Telephone Encounter (Signed)
Pt advised. She would like to proceed with the imaging.  She agreed to get the blood work done.   Thanks,   -Vernona Rieger

## 2016-11-04 ENCOUNTER — Telehealth: Payer: Self-pay | Admitting: Physician Assistant

## 2016-11-04 NOTE — Telephone Encounter (Signed)
Ok thank you-aa

## 2016-11-04 NOTE — Telephone Encounter (Signed)
Called and left message. I'm not in clinic the next almost three weeks so I'll try her again Monday.

## 2016-11-04 NOTE — Telephone Encounter (Signed)
Can you speak to the patient? -aa

## 2016-11-04 NOTE — Telephone Encounter (Signed)
Pt would like a call back to discuss findings that you discovered on her imaging that she had done at [facility]

## 2016-11-08 ENCOUNTER — Ambulatory Visit: Payer: BLUE CROSS/BLUE SHIELD

## 2016-11-08 NOTE — Telephone Encounter (Signed)
Have had extensive phone conversation with Kathy Walton today. I have gone over her imaging findings from [facility] last year and why I am ordering the follow up tests to include the CT chest for hilar lymphadenopathy, US pelvic/transvaginal for the right adnexal cyst, and bloodwork for the thyroid lesion. She reports that she does have a history of ovarian cysts and had her left ovary and fallopian tubes removed, so her ovary is the only one remaining. She is agreeable to getting these labs and tests and is grateful for the follow up.   She brings up a question about her continued pain. She has recently seen Altamese Cabal at [facility] for right knee pain. She also has an underlying diagnosis of seronegative rheumatoid arthritis. When I saw her in clinic I prescribed her Meloxicam. She says this is not working for her. I told her that I think she has multiple contributing factors to her sources of pain and we can address them better in clinic when I see her next. She wants to know what to do when the pain is really bad. She says she is aware of her substance history, but reports that narcotics were never her "drug of choice." She is wary of pain clinics because she had been on 5-325 of oxycodone/acetaminophen previously and then was put on suboxone, which made her feel terrible. She says she also will not take methadone or subutex because these won't work for her. She asks about tramadol and if it is a narcotic and I explained to her that it was.  I reiterated that with her past history of substance abuse, I will not be writing for any controlled substances. I think if we cannot address her pain through other means of evaluating her underlying disease processes, she will likely need to be managed by a pain clinic.   Just FYI that I have made contact with this patient and explained the above. No further action necessary.

## 2016-11-08 NOTE — Telephone Encounter (Signed)
Noted-aa 

## 2016-11-09 NOTE — Telephone Encounter (Signed)
LMTCB 11/09/2016  Thanks,   -Ramonita Koenig  

## 2016-11-09 NOTE — Telephone Encounter (Signed)
Can we please call patient and see why she missed her ultrasound? I just spoke to her yesterday and she knew her appointment was at 1:45 PM.

## 2016-11-10 ENCOUNTER — Ambulatory Visit: Payer: BLUE CROSS/BLUE SHIELD

## 2016-11-16 ENCOUNTER — Ambulatory Visit: Admission: RE | Admit: 2016-11-16 | Payer: BLUE CROSS/BLUE SHIELD | Source: Ambulatory Visit

## 2016-11-17 NOTE — Telephone Encounter (Signed)
lmtcb-aa 

## 2016-12-01 ENCOUNTER — Ambulatory Visit: Admission: RE | Admit: 2016-12-01 | Payer: BLUE CROSS/BLUE SHIELD | Source: Ambulatory Visit

## 2016-12-02 ENCOUNTER — Ambulatory Visit: Payer: BLUE CROSS/BLUE SHIELD | Admitting: Physician Assistant

## 2016-12-07 ENCOUNTER — Ambulatory Visit: Payer: BLUE CROSS/BLUE SHIELD

## 2016-12-08 ENCOUNTER — Ambulatory Visit: Admission: RE | Admit: 2016-12-08 | Payer: BLUE CROSS/BLUE SHIELD | Source: Ambulatory Visit

## 2016-12-09 ENCOUNTER — Telehealth: Payer: Self-pay | Admitting: Physician Assistant

## 2016-12-09 ENCOUNTER — Ambulatory Visit (INDEPENDENT_AMBULATORY_CARE_PROVIDER_SITE_OTHER): Payer: BLUE CROSS/BLUE SHIELD | Admitting: Physician Assistant

## 2016-12-09 DIAGNOSIS — Z5321 Procedure and treatment not carried out due to patient leaving prior to being seen by health care provider: Secondary | ICD-10-CM

## 2016-12-09 NOTE — Progress Notes (Deleted)
       Patient: Kathy Walton Female    DOB: [birth year]   41 y.o.   MRN: [id number] Visit Date: 12/09/2016  Today's Provider: Trey Sailors, PA-C   No chief complaint on file.  Subjective:    Anxiety  Presents for follow-up (She was last seen 4 weeks ago, and she was referred to psych at that time.) visit.         Allergies  Allergen Reactions  . Cephalexin      Current Outpatient Prescriptions:  .  cyclobenzaprine (FLEXERIL) 10 MG tablet, Take 10 mg by mouth 3 (three) times daily as needed., Disp: , Rfl:  .  meloxicam (MOBIC) 7.5 MG tablet, Take 1-2 tablets daily. No more than this. Do not take with other NSAIDs., Disp: 60 tablet, Rfl: 0 .  Prenatal Vit-Fe Fumarate-FA (MULTIVITAMIN-PRENATAL) 27-0.8 MG TABS tablet, Take 1 tablet by mouth daily at 12 noon., Disp: , Rfl:   Review of Systems  Social History  Substance Use Topics  . Smoking status: Current Every Day Smoker    Types: Cigarettes  . Smokeless tobacco: Never Used  . Alcohol use No   Objective:   There were no vitals taken for this visit. There were no vitals filed for this visit.   Physical Exam      Assessment & Plan:           Trey Sailors, PA-C  [facility]

## 2016-12-09 NOTE — Telephone Encounter (Signed)
The patient arrived this morning for an 8:00AM appointment. She initially told front desk that she had an 8:30AM apt and needed to make that on time. When she was brought back by the medical assistant, she then stated that she only had five minutes. The medical assistant advised her that it would take longer than 5 minutes to be checked in and seen. At this point, the patient left without being seen. The patient should be reminded that we have a three no-show policy before dismissal, thank you.

## 2016-12-12 NOTE — Telephone Encounter (Signed)
Kathy Walton, Please review Thanks ED

## 2016-12-14 NOTE — Progress Notes (Signed)
The patient arrived this morning for an 8:00AM appointment. She initially told front desk that she had an 8:30AM apt and needed to make that on time. When she was brought back by the medical assistant, she then stated that she only had five minutes. The medical assistant advised her that it would take longer than 5 minutes to be checked in and seen. At this point, the patient left without being seen.

## 2016-12-15 ENCOUNTER — Telehealth: Payer: Self-pay | Admitting: Physician Assistant
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
## 2016-12-16 ENCOUNTER — Ambulatory Visit: Payer: BLUE CROSS/BLUE SHIELD | Attending: Physician Assistant

## 2016-12-16 ENCOUNTER — Ambulatory Visit: Admission: RE | Admit: 2016-12-16 | Payer: BLUE CROSS/BLUE SHIELD | Source: Ambulatory Visit

## 2016-12-19 ENCOUNTER — Encounter: Payer: Self-pay | Admitting: Physician Assistant

## 2016-12-19 ENCOUNTER — Ambulatory Visit: Payer: Self-pay | Admitting: Physician Assistant

## 2017-01-16 DIAGNOSIS — M069 Rheumatoid arthritis, unspecified: Secondary | ICD-10-CM | POA: Insufficient documentation

## 2017-01-16 DIAGNOSIS — L309 Dermatitis, unspecified: Secondary | ICD-10-CM | POA: Insufficient documentation

## 2017-01-24 ENCOUNTER — Ambulatory Visit: Payer: BLUE CROSS/BLUE SHIELD | Admitting: Family Medicine

## 2017-02-01 ENCOUNTER — Ambulatory Visit: Payer: BLUE CROSS/BLUE SHIELD | Admitting: Family Medicine

## 2017-02-07 ENCOUNTER — Ambulatory Visit: Payer: BLUE CROSS/BLUE SHIELD | Admitting: Family Medicine

## 2017-06-27 ENCOUNTER — Encounter: Payer: Self-pay | Admitting: Maternal Newborn

## 2017-08-09 ENCOUNTER — Encounter: Payer: BLUE CROSS/BLUE SHIELD | Admitting: Certified Nurse Midwife

## 2017-08-14 ENCOUNTER — Ambulatory Visit (INDEPENDENT_AMBULATORY_CARE_PROVIDER_SITE_OTHER): Payer: Self-pay | Admitting: Certified Nurse Midwife

## 2017-08-14 ENCOUNTER — Encounter: Payer: Self-pay | Admitting: Certified Nurse Midwife

## 2017-08-14 VITALS — BP 127/72 | HR 105 | Wt 230.1 lb

## 2017-08-14 DIAGNOSIS — Z3482 Encounter for supervision of other normal pregnancy, second trimester: Secondary | ICD-10-CM

## 2017-08-14 NOTE — Addendum Note (Signed)
Addended by: Adelene Amas on: 08/14/2017 03:37 PM   Modules accepted: Orders
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
## 2017-08-14 NOTE — Patient Instructions (Signed)
Eating Plan for Pregnant Women While you are pregnant, your body will require additional nutrition to help support your growing baby. It is recommended that you consume:  150 additional calories each day during your first trimester.  300 additional calories each day during your second trimester.  300 additional calories each day during your third trimester.  Eating a healthy, well-balanced diet is very important for your health and for your baby's health. You also have a higher need for some vitamins and minerals, such as folic acid, calcium, iron, and vitamin D. What do I need to know about eating during pregnancy?  Do not try to lose weight or go on a diet during pregnancy.  Choose healthy, nutritious foods. Choose  of a sandwich with a glass of milk instead of a candy bar or a high-calorie sugar-sweetened beverage.  Limit your overall intake of foods that have "empty calories." These are foods that have little nutritional value, such as sweets, desserts, candies, sugar-sweetened beverages, and fried foods.  Eat a variety of foods, especially fruits and vegetables.  Take a prenatal vitamin to help meet the additional needs during pregnancy, specifically for folic acid, iron, calcium, and vitamin D.  Remember to stay active. Ask your health care provider for exercise recommendations that are specific to you.  Practice good food safety and cleanliness, such as washing your hands before you eat and after you prepare raw meat. This helps to prevent foodborne illnesses, such as listeriosis, that can be very dangerous for your baby. Ask your health care provider for more information about listeriosis. What does 150 extra calories look like? Healthy options for an additional 150 calories each day could be any of the following:  Plain low-fat yogurt (6-8 oz) with  cup of berries.  1 apple with 2 teaspoons of peanut butter.  Cut-up vegetables with  cup of hummus.  Low-fat chocolate milk  (8 oz or 1 cup).  1 string cheese with 1 medium orange.   of a peanut butter and jelly sandwich on whole-wheat bread (1 tsp of peanut butter).  For 300 calories, you could eat two of those healthy options each day. What is a healthy amount of weight to gain? The recommended amount of weight for you to gain is based on your pre-pregnancy BMI. If your pre-pregnancy BMI was:  Less than 18 (underweight), you should gain 28-40 lb.  18-24.9 (normal), you should gain 25-35 lb.  25-29.9 (overweight), you should gain 15-25 lb.  Greater than 30 (obese), you should gain 11-20 lb.  What if I am having twins or multiples? Generally, pregnant women who will be having twins or multiples may need to increase their daily calories by 300-600 calories each day. The recommended range for total weight gain is 25-54 lb, depending on your pre-pregnancy BMI. Talk with your health care provider for specific guidance about additional nutritional needs, weight gain, and exercise during your pregnancy. What foods can I eat? Grains Any grains. Try to choose whole grains, such as whole-wheat bread, oatmeal, or brown rice. Vegetables Any vegetables. Try to eat a variety of colors and types of vegetables to get a full range of vitamins and minerals. Remember to wash your vegetables well before eating. Fruits Any fruits. Try to eat a variety of colors and types of fruit to get a full range of vitamins and minerals. Remember to wash your fruits well before eating. Meats and Other Protein Sources Lean meats, including chicken, turkey, fish, and lean cuts of beef, veal,   or pork. Make sure that all meats are cooked to "well done." Tofu. Tempeh. Beans. Eggs. Peanut butter and other nut butters. Seafood, such as shrimp, crab, and lobster. If you choose fish, select types that are higher in omega-3 fatty acids, including salmon, herring, mussels, trout, sardines, and pollock. Make sure that all meats are cooked to food-safe  temperatures. Dairy Pasteurized milk and milk alternatives. Pasteurized yogurt and pasteurized cheese. Cottage cheese. Sour cream. Beverages Water. Juices that contain 100% fruit juice or vegetable juice. Caffeine-free teas and decaffeinated coffee. Drinks that contain caffeine are okay to drink, but it is better to avoid caffeine. Keep your total caffeine intake to less than 200 mg each day (12 oz of coffee, tea, or soda) or as directed by your health care provider. Condiments Any pasteurized condiments. Sweets and Desserts Any sweets and desserts. Fats and Oils Any fats and oils. The items listed above may not be a complete list of recommended foods or beverages. Contact your dietitian for more options. What foods are not recommended? Vegetables Unpasteurized (raw) vegetable juices. Fruits Unpasteurized (raw) fruit juices. Meats and Other Protein Sources Cured meats that have nitrates, such as bacon, salami, and hotdogs. Luncheon meats, bologna, or other deli meats (unless they are reheated until they are steaming hot). Refrigerated pate, meat spreads from a meat counter, smoked seafood that is found in the refrigerated section of a store. Raw fish, such as sushi or sashimi. High mercury content fish, such as tilefish, shark, swordfish, and king mackerel. Raw meats, such as tuna or beef tartare. Undercooked meats and poultry. Make sure that all meats are cooked to food-safe temperatures. Dairy Unpasteurized (raw) milk and any foods that have raw milk in them. Soft cheeses, such as feta, queso blanco, queso fresco, Brie, Camembert cheeses, blue-veined cheeses, and Panela cheese (unless it is made with pasteurized milk, which must be stated on the label). Beverages Alcohol. Sugar-sweetened beverages, such as sodas, teas, or energy drinks. Condiments Homemade fermented foods and drinks, such as pickles, sauerkraut, or kombucha drinks. (Store-bought pasteurized versions of these are  okay.) Other Salads that are made in the store, such as ham salad, chicken salad, egg salad, tuna salad, and seafood salad. The items listed above may not be a complete list of foods and beverages to avoid. Contact your dietitian for more information. This information is not intended to replace advice given to you by your health care provider. Make sure you discuss any questions you have with your health care provider. Document Released: 03/21/2014 Document Revised: 11/12/2015 Document Reviewed: 11/19/2013 Elsevier Interactive Patient Education  2018 [employer] Prenatal Care WHAT IS PRENATAL CARE? Prenatal care is the process of caring for a pregnant woman before she gives birth. Prenatal care makes sure that she and her baby remain as healthy as possible throughout pregnancy. Prenatal care may be provided by a midwife, family practice health care provider, or a childbirth and pregnancy specialist (obstetrician). Prenatal care may include physical examinations, testing, treatments, and education on nutrition, lifestyle, and social support services. WHY IS PRENATAL CARE SO IMPORTANT? Early and consistent prenatal care increases the chance that you and your baby will remain healthy throughout your pregnancy. This type of care also decreases a baby's risk of being born too early (prematurely), or being born smaller than expected (small for gestational age). Any underlying medical conditions you may have that could pose a risk during your pregnancy are discussed during prenatal care visits. You will also be monitored regularly for any new   conditions that may arise during your pregnancy so they can be treated quickly and effectively. WHAT HAPPENS DURING PRENATAL CARE VISITS? Prenatal care visits may include the following: Discussion Tell your health care provider about any new signs or symptoms you have experienced since your last visit. These might include:  Nausea or vomiting.  Increased or  decreased level of energy.  Difficulty sleeping.  Back or leg pain.  Weight changes.  Frequent urination.  Shortness of breath with physical activity.  Changes in your skin, such as the development of a rash or itchiness.  Vaginal discharge or bleeding.  Feelings of excitement or nervousness.  Changes in your baby's movements.  You may want to write down any questions or topics you want to discuss with your health care provider and bring them with you to your appointment. Examination During your first prenatal care visit, you will likely have a complete physical exam. Your health care provider will often examine your vagina, cervix, and the position of your uterus, as well as check your heart, lungs, and other body systems. As your pregnancy progresses, your health care provider will measure the size of your uterus and your baby's position inside your uterus. He or she may also examine you for early signs of labor. Your prenatal visits may also include checking your blood pressure and, after about 10-12 weeks of pregnancy, listening to your baby's heartbeat. Testing Regular testing often includes:  Urinalysis. This checks your urine for glucose, protein, or signs of infection.  Blood count. This checks the levels of white and red blood cells in your body.  Tests for sexually transmitted infections (STIs). Testing for STIs at the beginning of pregnancy is routinely done and is required in many states.  Antibody testing. You will be checked to see if you are immune to certain illnesses, such as rubella, that can affect a developing fetus.  Glucose screen. Around 24-28 weeks of pregnancy, your blood glucose level will be checked for signs of gestational diabetes. Follow-up tests may be recommended.  Group B strep. This is a bacteria that is commonly found inside a woman's vagina. This test will inform your health care provider if you need an antibiotic to reduce the amount of this  bacteria in your body prior to labor and childbirth.  Ultrasound. Many pregnant women undergo an ultrasound screening around 18-20 weeks of pregnancy to evaluate the health of the fetus and check for any developmental abnormalities.  HIV (human immunodeficiency virus) testing. Early in your pregnancy, you will be screened for HIV. If you are at high risk for HIV, this test may be repeated during your third trimester of pregnancy.  You may be offered other testing based on your age, personal or family medical history, or other factors. HOW OFTEN SHOULD I PLAN TO SEE MY HEALTH CARE PROVIDER FOR PRENATAL CARE? Your prenatal care check-up schedule depends on any medical conditions you have before, or develop during, your pregnancy. If you do not have any underlying medical conditions, you will likely be seen for checkups:  Monthly, during the first 6 months of pregnancy.  Twice a month during months 7 and 8 of pregnancy.  Weekly starting in the 9th month of pregnancy and until delivery.  If you develop signs of early labor or other concerning signs or symptoms, you may need to see your health care provider more often. Ask your health care provider what prenatal care schedule is best for you. WHAT CAN I DO TO KEEP MYSELF AND   MY BABY AS HEALTHY AS POSSIBLE DURING MY PREGNANCY?  Take a prenatal vitamin containing 400 micrograms (0.4 mg) of folic acid every day. Your health care provider may also ask you to take additional vitamins such as iodine, vitamin D, iron, copper, and zinc.  Take 1500-2000 mg of calcium daily starting at your 20th week of pregnancy until you deliver your baby.  Make sure you are up to date on your vaccinations. Unless directed otherwise by your health care provider: ? You should receive a tetanus, diphtheria, and pertussis (Tdap) vaccination between the 27th and 36th week of your pregnancy, regardless of when your last Tdap immunization occurred. This helps protect your baby  from whooping cough (pertussis) after he or she is born. ? You should receive an annual inactivated influenza vaccine (IIV) to help protect you and your baby from influenza. This can be done at any point during your pregnancy.  Eat a well-rounded diet that includes: ? Fresh fruits and vegetables. ? Lean proteins. ? Calcium-rich foods such as milk, yogurt, hard cheeses, and dark, leafy greens. ? Whole grain breads.  Do noteat seafood high in mercury, including: ? Swordfish. ? Tilefish. ? Shark. ? King mackerel. ? More than 6 oz tuna per week.  Do not eat: ? Raw or undercooked meats or eggs. ? Unpasteurized foods, such as soft cheeses (brie, blue, or feta), juices, and milks. ? Lunch meats. ? Hot dogs that have not been heated until they are steaming.  Drink enough water to keep your urine clear or pale yellow. For many women, this may be 10 or more 8 oz glasses of water each day. Keeping yourself hydrated helps deliver nutrients to your baby and may prevent the start of pre-term uterine contractions.  Do not use any tobacco products including cigarettes, chewing tobacco, or electronic cigarettes. If you need help quitting, ask your health care provider.  Do not drink beverages containing alcohol. No safe level of alcohol consumption during pregnancy has been determined.  Do not use any illegal drugs. These can harm your developing baby or cause a miscarriage.  Ask your health care provider or pharmacist before taking any prescription or over-the-counter medicines, herbs, or supplements.  Limit your caffeine intake to no more than 200 mg per day.  Exercise. Unless told otherwise by your health care provider, try to get 30 minutes of moderate exercise most days of the week. Do not  do high-impact activities, contact sports, or activities with a high risk of falling, such as horseback riding or downhill skiing.  Get plenty of rest.  Avoid anything that raises your body temperature,  such as hot tubs and saunas.  If you own a cat, do not empty its litter box. Bacteria contained in cat feces can cause an infection called toxoplasmosis. This can result in serious harm to the fetus.  Stay away from chemicals such as insecticides, lead, mercury, and cleaning or paint products that contain solvents.  Do not have any X-rays taken unless medically necessary.  Take a childbirth and breastfeeding preparation class. Ask your health care provider if you need a referral or recommendation.  This information is not intended to replace advice given to you by your health care provider. Make sure you discuss any questions you have with your health care provider. Document Released: 06/09/2003 Document Revised: 11/09/2015 Document Reviewed: 08/21/2013 Elsevier Interactive Patient Education  2017 [employer]  

## 2017-08-14 NOTE — Progress Notes (Signed)
New pt is here for a new OB physical. LPS 4 months ago WNL. Pt had confirmation of pregnancy was A[facility]D 06/28/17. LMP 04/11/18. This is pts first OB visit.

## 2017-08-15 ENCOUNTER — Ambulatory Visit (INDEPENDENT_AMBULATORY_CARE_PROVIDER_SITE_OTHER): Payer: Self-pay

## 2017-08-15 ENCOUNTER — Other Ambulatory Visit: Payer: Self-pay

## 2017-08-15 DIAGNOSIS — Z3482 Encounter for supervision of other normal pregnancy, second trimester: Secondary | ICD-10-CM

## 2017-08-15 LAB — MICROSCOPIC EXAMINATION
CASTS: NONE SEEN /LPF
Epithelial Cells (non renal): >10 /hpf — AB (ref 0–10)

## 2017-08-15 LAB — URINALYSIS, ROUTINE W REFLEX MICROSCOPIC
BILIRUBIN UA: NEGATIVE
GLUCOSE, UA: NEGATIVE
KETONES UA: NEGATIVE
LEUKOCYTES UA: NEGATIVE
Nitrite, UA: NEGATIVE
Protein, UA: NEGATIVE
SPEC GRAV UA: 1.021 (ref 1.005–1.030)
Urobilinogen, Ur: 1 mg/dL (ref 0.2–1.0)
pH, UA: 6.5 (ref 5.0–7.5)

## 2017-08-15 LAB — GC/CHLAMYDIA PROBE AMP
Chlamydia trachomatis, NAA: NEGATIVE
NEISSERIA GONORRHOEAE BY PCR: NEGATIVE

## 2017-08-16 LAB — CBC WITH DIFFERENTIAL
BASOS: 0 %
Basophils Absolute: 0 10*3/uL (ref 0.0–0.2)
EOS (ABSOLUTE): 0.1 10*3/uL (ref 0.0–0.4)
Eos: 1 %
Hematocrit: 40.5 % (ref 34.0–46.6)
Hemoglobin: 13.8 g/dL (ref 11.1–15.9)
Immature Grans (Abs): 0 10*3/uL (ref 0.0–0.1)
Immature Granulocytes: 0 %
Lymphocytes Absolute: 1.9 10*3/uL (ref 0.7–3.1)
Lymphs: 16 %
MCH: 29.1 pg (ref 26.6–33.0)
MCHC: 34.1 g/dL (ref 31.5–35.7)
MCV: 85 fL (ref 79–97)
MONOS ABS: 0.5 10*3/uL (ref 0.1–0.9)
Monocytes: 5 %
NEUTROS PCT: 78 %
Neutrophils Absolute: 9.1 10*3/uL — ABNORMAL HIGH (ref 1.4–7.0)
RBC: 4.74 x10E6/uL (ref 3.77–5.28)
RDW: 14.7 % (ref 12.3–15.4)
WBC: 11.7 10*3/uL — AB (ref 3.4–10.8)

## 2017-08-16 LAB — VARICELLA ZOSTER ANTIBODY, IGG: Varicella zoster IgG: 1065 index (ref >165)

## 2017-08-16 LAB — HEMOGLOBIN A1C
Est. average glucose Bld gHb Est-mCnc: 108 mg/dL
HEMOGLOBIN A1C: 5.4 % (ref 4.8–5.6)

## 2017-08-16 LAB — URINE CULTURE: Organism ID, Bacteria: NO GROWTH

## 2017-08-16 LAB — ABO AND RH: Rh Factor: POSITIVE

## 2017-08-16 LAB — RUBELLA SCREEN: RUBELLA: 1.64 {index} (ref >0.99)

## 2017-08-16 LAB — HEPATITIS B SURFACE ANTIGEN: Hepatitis B Surface Ag: NEGATIVE

## 2017-08-16 LAB — TSH: TSH: 0.514 u[IU]/mL (ref 0.450–4.500)

## 2017-08-16 LAB — RPR: RPR: NONREACTIVE

## 2017-08-16 LAB — HIV ANTIBODY (ROUTINE TESTING W REFLEX): HIV SCREEN 4TH GENERATION: NONREACTIVE

## 2017-08-16 LAB — ANTIBODY SCREEN: Antibody Screen: NEGATIVE

## 2017-08-21 LAB — OXYCODONE/OXYMORPHONE CONFIRM
OXYCODONE/OXYMORPH: POSITIVE — AB
OXYCODONE: 1287 ng/mL
OXYCODONE: POSITIVE — AB
OXYMORPHONE CONFIRM: 824 ng/mL
OXYMORPHONE: POSITIVE — AB

## 2017-08-21 LAB — COCAINE (GC/MS), URINE
BENZOYLECGONINE (GC/MS): 718200 ng/mL
COCAINE + METABOLITE: POSITIVE — AB

## 2017-08-21 LAB — CANNABINOID (GC/MS), URINE
Cannabinoid: POSITIVE — AB
Carboxy THC (GC/MS): 670 ng/mL

## 2017-08-21 LAB — MONITOR DRUG PROFILE 14(MW)
Amphetamine Scrn, Ur: NEGATIVE ng/mL
BARBITURATE SCREEN URINE: NEGATIVE ng/mL
BENZODIAZEPINE SCREEN, URINE: NEGATIVE ng/mL
BUPRENORPHINE, URINE: NEGATIVE ng/mL
Creatinine(Crt), U: 121.5 mg/dL (ref 20.0–300.0)
FENTANYL, URINE: NEGATIVE pg/mL
Meperidine Screen, Urine: NEGATIVE ng/mL
Methadone Screen, Urine: NEGATIVE ng/mL
PH UR, DRUG SCRN: 6.3 (ref 4.5–8.9)
PHENCYCLIDINE QUANTITATIVE URINE: NEGATIVE ng/mL
PROPOXYPHENE SCREEN URINE: NEGATIVE ng/mL
SPECIFIC GRAVITY: 1.017
Tramadol Screen, Urine: NEGATIVE ng/mL

## 2017-08-21 LAB — OPIATES CONFIRMATION, URINE: OPIATES: NEGATIVE ng/mL

## 2017-08-21 LAB — NICOTINE SCREEN, URINE: Cotinine Ql Scrn, Ur: POSITIVE ng/mL — AB

## 2017-08-23 ENCOUNTER — Other Ambulatory Visit: Payer: Self-pay | Admitting: Certified Nurse Midwife

## 2017-08-23 DIAGNOSIS — O09522 Supervision of elderly multigravida, second trimester: Secondary | ICD-10-CM
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
## 2017-08-30 ENCOUNTER — Other Ambulatory Visit: Payer: Self-pay

## 2017-08-30 ENCOUNTER — Encounter: Payer: Self-pay | Admitting: Certified Nurse Midwife

## 2017-09-18 ENCOUNTER — Encounter: Payer: Self-pay | Admitting: Certified Nurse Midwife

## 2017-09-18 ENCOUNTER — Other Ambulatory Visit: Payer: Self-pay

## 2017-09-19 ENCOUNTER — Ambulatory Visit (INDEPENDENT_AMBULATORY_CARE_PROVIDER_SITE_OTHER): Payer: Self-pay | Admitting: Certified Nurse Midwife

## 2017-09-19 ENCOUNTER — Ambulatory Visit (INDEPENDENT_AMBULATORY_CARE_PROVIDER_SITE_OTHER): Payer: Self-pay

## 2017-09-19 VITALS — BP 127/77 | HR 100 | Wt 234.1 lb

## 2017-09-19 DIAGNOSIS — Z87898 Personal history of other specified conditions: Secondary | ICD-10-CM | POA: Insufficient documentation

## 2017-09-19 DIAGNOSIS — Z90721 Acquired absence of ovaries, unilateral: Secondary | ICD-10-CM | POA: Insufficient documentation

## 2017-09-19 DIAGNOSIS — O09522 Supervision of elderly multigravida, second trimester: Secondary | ICD-10-CM

## 2017-09-19 DIAGNOSIS — Z9079 Acquired absence of other genital organ(s): Secondary | ICD-10-CM

## 2017-09-19 DIAGNOSIS — F1491 Cocaine use, unspecified, in remission: Secondary | ICD-10-CM

## 2017-09-19 DIAGNOSIS — F1291 Cannabis use, unspecified, in remission: Secondary | ICD-10-CM

## 2017-09-19 DIAGNOSIS — O0930 Supervision of pregnancy with insufficient antenatal care, unspecified trimester: Secondary | ICD-10-CM

## 2017-09-19 DIAGNOSIS — F17200 Nicotine dependence, unspecified, uncomplicated: Secondary | ICD-10-CM

## 2017-09-19 LAB — POCT URINALYSIS DIPSTICK
Bilirubin, UA: NEGATIVE
Glucose, UA: NEGATIVE
Ketones, UA: NEGATIVE
LEUKOCYTES UA: NEGATIVE
NITRITE UA: NEGATIVE
PH UA: 5 (ref 5.0–8.0)
PROTEIN UA: NEGATIVE
RBC UA: NEGATIVE
SPEC GRAV UA: 1.025 (ref 1.010–1.025)
UROBILINOGEN UA: 0.2 U/dL

## 2017-09-19 NOTE — Progress Notes (Signed)
Pt is here for an ROB visit. 

## 2017-09-19 NOTE — Patient Instructions (Signed)
Common Medications Safe in Pregnancy  Acne:      Constipation:  Benzoyl Peroxide     Colace  Clindamycin      Dulcolax Suppository  Topica Erythromycin     Fibercon  Salicylic Acid      Metamucil         Miralax AVOID:        Senakot   Accutane    Cough:  Retin-A       Cough Drops  Tetracycline      Phenergan w/ Codeine if Rx  Minocycline      Robitussin (Plain & DM)  Antibiotics:     Crabs/Lice:  Ceclor       RID  Cephalosporins    AVOID:  E-Mycins      Kwell  Keflex  Macrobid/Macrodantin   Diarrhea:  Penicillin      Kao-Pectate  Zithromax      Imodium AD         PUSH FLUIDS AVOID:       Cipro     Fever:  Tetracycline      Tylenol (Regular or Extra  Minocycline       Strength)  Levaquin      Extra Strength-Do not          Exceed 8 tabs/24 hrs Caffeine:        <200mg/day (equiv. To 1 cup of coffee or  approx. 3 12 oz sodas)         Gas: Cold/Hayfever:       Gas-X  Benadryl      Mylicon  Claritin       Phazyme  **Claritin-D        Chlor-Trimeton    Headaches:  Dimetapp      ASA-Free Excedrin  Drixoral-Non-Drowsy     Cold Compress  Mucinex (Guaifenasin)     Tylenol (Regular or Extra  Sudafed/Sudafed-12 Hour     Strength)  **Sudafed PE Pseudoephedrine   Tylenol Cold & Sinus     Vicks Vapor Rub  Zyrtec  **AVOID if Problems With Blood Pressure         Heartburn: Avoid lying down for at least 1 hour after meals  Aciphex      Maalox     Rash:  Milk of Magnesia     Benadryl    Mylanta       1% Hydrocortisone Cream  Pepcid  Pepcid Complete   Sleep Aids:  Prevacid      Ambien   Prilosec       Benadryl  Rolaids       Chamomile Tea  Tums (Limit 4/day)     Unisom  Zantac       Tylenol PM         Warm milk-add vanilla or  Hemorrhoids:       Sugar for taste  Anusol/Anusol H.C.  (RX: Analapram 2.5%)  Sugar Substitutes:  Hydrocortisone OTC     Ok in moderation  Preparation H      Tucks        Vaseline lotion applied to tissue with  wiping    Herpes:     Throat:  Acyclovir      Oragel  Famvir  Valtrex     Vaccines:         Flu Shot Leg Cramps:       *Gardasil  Benadryl      Hepatitis A         Hepatitis B Nasal Spray:         Pneumovax  Saline Nasal Spray     Polio Booster         Tetanus Nausea:       Tuberculosis test or PPD  Vitamin B6 25 mg TID   AVOID:    Dramamine      *Gardasil  Emetrol       Live Poliovirus  Ginger Root 250 mg QID    MMR (measles, mumps &  High Complex Carbs @ Bedtime    rebella)  Sea Bands-Accupressure    Varicella (Chickenpox)  Unisom 1/2 tab TID     *No known complications           If received before Pain:         Known pregnancy;   Darvocet       Resume series after  Lortab        Delivery  Percocet    Yeast:   Tramadol      Femstat  Tylenol 3      Gyne-lotrimin  Ultram       Monistat  Vicodin           MISC:         All Sunscreens           Hair Coloring/highlights          Insect Repellant's          (Including DEET)         Mystic Tans Second Trimester of Pregnancy The second trimester is from week 13 through week 28, month 4 through 6. This is often the time in pregnancy that you feel your best. Often times, morning sickness has lessened or quit. You may have more energy, and you may get hungry more often. Your unborn baby (fetus) is growing rapidly. At the end of the sixth month, he or she is about 9 inches long and weighs about 1 pounds. You will likely feel the baby move (quickening) between 18 and 20 weeks of pregnancy. Follow these instructions at home:  Avoid all smoking, herbs, and alcohol. Avoid drugs not approved by your doctor.  Do not use any tobacco products, including cigarettes, chewing tobacco, and electronic cigarettes. If you need help quitting, ask your doctor. You may get counseling or other support to help you quit.  Only take medicine as told by your doctor. Some medicines are safe and some are not during pregnancy.  Exercise only as told by your  doctor. Stop exercising if you start having cramps.  Eat regular, healthy meals.  Wear a good support bra if your breasts are tender.  Do not use hot tubs, steam rooms, or saunas.  Wear your seat belt when driving.  Avoid raw meat, uncooked cheese, and liter boxes and soil used by cats.  Take your prenatal vitamins.  Take 1500-2000 milligrams of calcium daily starting at the 20th week of pregnancy until you deliver your baby.  Try taking medicine that helps you poop (stool softener) as needed, and if your doctor approves. Eat more fiber by eating fresh fruit, vegetables, and whole grains. Drink enough fluids to keep your pee (urine) clear or pale yellow.  Take warm water baths (sitz baths) to soothe pain or discomfort caused by hemorrhoids. Use hemorrhoid cream if your doctor approves.  If you have puffy, bulging veins (varicose veins), wear support hose. Raise (elevate) your feet for 15 minutes, 3-4 times a day. Limit salt in your diet.  Avoid heavy lifting, wear low heals, and sit up straight.    Rest with your legs raised if you have leg cramps or low back pain.  Visit your dentist if you have not gone during your pregnancy. Use a soft toothbrush to brush your teeth. Be gentle when you floss.  You can have sex (intercourse) unless your doctor tells you not to.  Go to your doctor visits. Get help if:  You feel dizzy.  You have mild cramps or pressure in your lower belly (abdomen).  You have a nagging pain in your belly area.  You continue to feel sick to your stomach (nauseous), throw up (vomit), or have watery poop (diarrhea).  You have bad smelling fluid coming from your vagina.  You have pain with peeing (urination). Get help right away if:  You have a fever.  You are leaking fluid from your vagina.  You have spotting or bleeding from your vagina.  You have severe belly cramping or pain.  You lose or gain weight rapidly.  You have trouble catching your  breath and have chest pain.  You notice sudden or extreme puffiness (swelling) of your face, hands, ankles, feet, or legs.  You have not felt the baby move in over an hour.  You have severe headaches that do not go away with medicine.  You have vision changes. This information is not intended to replace advice given to you by your health care provider. Make sure you discuss any questions you have with your health care provider. Document Released: 08/31/2009 Document Revised: 11/12/2015 Document Reviewed: 08/07/2012 Elsevier Interactive Patient Education  2017 Elsevier Inc.  

## 2017-09-19 NOTE — Progress Notes (Signed)
ROB-Meeting with Jola Babinski, Pregnancy Case Manager, prior to today's appointment. Advised of assistance available with [facility] program, verbalized understanding. Agreed to repeat UDS today. Currently in treatment with [facility]. Verbal contracted to keep scheduled appointments. Reviewed red flag symptoms and when to call. RTC x 2-3 weeks for ROB or sooner if needed.   ULTRASOUND REPORT  Location: [facility] Date of Service:  09/19/2017  Indications: Anatomy Findings:  Mason Jim intrauterine pregnancy is visualized with FHR at 145 BPM. Biometrics give an (U/S) Gestational age of 77 4/7 weeks and an (U/S) EDD of 02/02/18. The clinical EDD should have been adjusted from 01/16/18 to 02/04/18 based off of the previous ultrasound performed on 08/15/17.  The measurements from today's exam correlate with the adjusted EDD of 02/04/18.  Fetal presentation is breech.  EFW: 358 grams (0lb 13oz). Placenta: Posterior and grade 1. AFI: WNL subjectively.  Anatomic survey is complete and appears WNL; Gender - Female.   Right Ovary was not visualized. Left Ovary measures 2.0 x 1.3 x 1.2 cm. It is normal appearance. There is no obvious evidence of a corpus luteal cyst. Survey of the adnexa demonstrates no adnexal masses. There is no free peritoneal fluid in the cul de sac.  Impression: 1. 20 4/7 week Viable Singleton Intrauterine pregnancy by U/S. 2. The EDD still needs to be adjusted to 02/04/18 based off of the previous ultrasound performed on 08/15/17.  Today's scan correlates with that date. 3. Normal Anatomy Scan  Recommendations: 1.Clinical correlation with the patient's History and Physical Exam. 2. Adjust EDD to 02/04/18 based off of previous ultrasound performed on 08/15/17.

## 2017-09-20 LAB — MONITOR DRUG PROFILE 14(MW)
AMPHETAMINE SCREEN URINE: NEGATIVE ng/mL
BARBITURATE SCREEN URINE: NEGATIVE ng/mL
BENZODIAZEPINE SCREEN, URINE: NEGATIVE ng/mL
Buprenorphine, Urine: NEGATIVE ng/mL
CANNABINOIDS UR QL SCN: NEGATIVE ng/mL
Cocaine (Metab) Scrn, Ur: NEGATIVE ng/mL
Creatinine(Crt), U: 117.9 mg/dL (ref 20.0–300.0)
Fentanyl, Urine: NEGATIVE pg/mL
METHADONE SCREEN, URINE: NEGATIVE ng/mL
Meperidine Screen, Urine: NEGATIVE ng/mL
OPIATE SCREEN URINE: NEGATIVE ng/mL
OXYCODONE+OXYMORPHONE UR QL SCN: NEGATIVE ng/mL
Ph of Urine: 5.7 (ref 4.5–8.9)
Phencyclidine Qn, Ur: NEGATIVE ng/mL
Propoxyphene Scrn, Ur: NEGATIVE ng/mL
SPECIFIC GRAVITY: 1.025
TRAMADOL SCREEN, URINE: NEGATIVE ng/mL

## 2017-10-03 ENCOUNTER — Encounter: Payer: Self-pay | Admitting: Certified Nurse Midwife

## 2017-10-10 ENCOUNTER — Ambulatory Visit (INDEPENDENT_AMBULATORY_CARE_PROVIDER_SITE_OTHER): Payer: Self-pay | Admitting: Certified Nurse Midwife

## 2017-10-10 VITALS — BP 138/83 | HR 126 | Wt 233.6 lb

## 2017-10-10 DIAGNOSIS — Z3A23 23 weeks gestation of pregnancy: Secondary | ICD-10-CM

## 2017-10-10 LAB — POCT URINALYSIS DIPSTICK
Bilirubin, UA: NEGATIVE
GLUCOSE UA: NEGATIVE
Ketones, UA: NEGATIVE
Leukocytes, UA: NEGATIVE
Nitrite, UA: NEGATIVE
Protein, UA: NEGATIVE
RBC UA: NEGATIVE
SPEC GRAV UA: 1.02 (ref 1.010–1.025)
Urobilinogen, UA: 0.2 E.U./dL
pH, UA: 6.5 (ref 5.0–8.0)

## 2017-10-10 MED ORDER — DOXYLAMINE-PYRIDOXINE ER 20-20 MG PO TBCR: 1.0000 | EXTENDED_RELEASE_TABLET | Freq: Two times a day (BID) | ORAL | 4 refills | Status: DC

## 2017-10-10 NOTE — Patient Instructions (Signed)

## 2017-10-10 NOTE — Progress Notes (Signed)
Patient Kathy Walton,doing well, reports good fetal movement.  Patient reports currently doing well at [facility]. UDS negative last visit. Reports some nausea, Bonjesta samples given and Rx sent to pharmacy. Reviewed Glucola testing for next week and anticipatory guidance for this. Patient in agreement and verbalizes understanding. Kathy Walton in 2 weeks next visit.  Kathy Walton,SNM/ Kathy Walton,CNM

## 2017-10-10 NOTE — Addendum Note (Signed)
Addended by: Brooke Dare on: 10/10/2017 02:52 PM   Modules accepted: Orders

## 2017-10-10 NOTE — Progress Notes (Signed)
Pt is here for an ROB visit. C/o nausea in AM.

## 2017-11-09 ENCOUNTER — Other Ambulatory Visit: Payer: Self-pay

## 2017-11-09 ENCOUNTER — Encounter: Payer: Self-pay | Admitting: Certified Nurse Midwife

## 2017-11-14 ENCOUNTER — Encounter: Payer: Self-pay | Admitting: Certified Nurse Midwife

## 2017-11-14 ENCOUNTER — Other Ambulatory Visit: Payer: Self-pay

## 2017-11-16 ENCOUNTER — Encounter: Payer: Self-pay | Admitting: Certified Nurse Midwife

## 2017-11-16 ENCOUNTER — Other Ambulatory Visit: Payer: Self-pay

## 2017-11-16 DIAGNOSIS — F1111 Opioid abuse, in remission: Secondary | ICD-10-CM | POA: Insufficient documentation

## 2017-11-16 DIAGNOSIS — O09529 Supervision of elderly multigravida, unspecified trimester: Secondary | ICD-10-CM | POA: Insufficient documentation

## 2017-11-17 ENCOUNTER — Encounter: Payer: Self-pay | Admitting: Certified Nurse Midwife

## 2017-11-17 ENCOUNTER — Other Ambulatory Visit: Payer: Self-pay

## 2017-11-23 ENCOUNTER — Other Ambulatory Visit: Payer: Self-pay

## 2017-11-23 ENCOUNTER — Encounter: Payer: Self-pay | Admitting: Certified Nurse Midwife

## 2017-11-27 ENCOUNTER — Encounter: Payer: Self-pay | Admitting: Certified Nurse Midwife

## 2017-11-27 ENCOUNTER — Other Ambulatory Visit: Payer: Self-pay

## 2017-12-01 ENCOUNTER — Encounter: Payer: Self-pay | Admitting: Certified Nurse Midwife

## 2017-12-01 ENCOUNTER — Other Ambulatory Visit: Payer: Self-pay

## 2017-12-11 ENCOUNTER — Inpatient Hospital Stay
Admission: EM | Admit: 2017-12-11 | Discharge: 2017-12-12 | DRG: 832 | Discharge status: Against Medical Advice | Payer: Medicaid Other | Attending: Certified Nurse Midwife | Admitting: Certified Nurse Midwife

## 2017-12-11 ENCOUNTER — Observation Stay: Payer: Medicaid Other

## 2017-12-11 DIAGNOSIS — F1111 Opioid abuse, in remission: Secondary | ICD-10-CM

## 2017-12-11 DIAGNOSIS — O26893 Other specified pregnancy related conditions, third trimester: Secondary | ICD-10-CM

## 2017-12-11 DIAGNOSIS — O288 Other abnormal findings on antenatal screening of mother: Secondary | ICD-10-CM

## 2017-12-11 DIAGNOSIS — O99283 Endocrine, nutritional and metabolic diseases complicating pregnancy, third trimester: Secondary | ICD-10-CM | POA: Diagnosis present

## 2017-12-11 DIAGNOSIS — F1410 Cocaine abuse, uncomplicated: Secondary | ICD-10-CM | POA: Diagnosis present

## 2017-12-11 DIAGNOSIS — N939 Abnormal uterine and vaginal bleeding, unspecified: Secondary | ICD-10-CM

## 2017-12-11 DIAGNOSIS — O42913 Preterm premature rupture of membranes, unspecified as to length of time between rupture and onset of labor, third trimester: Principal | ICD-10-CM | POA: Diagnosis present

## 2017-12-11 DIAGNOSIS — N898 Other specified noninflammatory disorders of vagina: Secondary | ICD-10-CM

## 2017-12-11 DIAGNOSIS — M069 Rheumatoid arthritis, unspecified: Secondary | ICD-10-CM | POA: Diagnosis present

## 2017-12-11 DIAGNOSIS — O9989 Other specified diseases and conditions complicating pregnancy, childbirth and the puerperium: Secondary | ICD-10-CM | POA: Diagnosis present

## 2017-12-11 DIAGNOSIS — O99323 Drug use complicating pregnancy, third trimester: Secondary | ICD-10-CM | POA: Diagnosis present

## 2017-12-11 DIAGNOSIS — Z3A32 32 weeks gestation of pregnancy: Secondary | ICD-10-CM

## 2017-12-11 DIAGNOSIS — Z79899 Other long term (current) drug therapy: Secondary | ICD-10-CM

## 2017-12-11 DIAGNOSIS — E039 Hypothyroidism, unspecified: Secondary | ICD-10-CM | POA: Diagnosis present

## 2017-12-11 LAB — CBC WITH DIFFERENTIAL/PLATELET
BASOS ABS: 0.1 10*3/uL (ref 0–0.1)
BASOS PCT: 1 %
EOS ABS: 0.3 10*3/uL (ref 0–0.7)
Eosinophils Relative: 2 %
HCT: 35 % (ref 35.0–47.0)
HEMOGLOBIN: 12.5 g/dL (ref 12.0–16.0)
Lymphocytes Relative: 18 %
Lymphs Abs: 2.4 10*3/uL (ref 1.0–3.6)
MCH: 30.3 pg (ref 26.0–34.0)
MCHC: 35.9 g/dL (ref 32.0–36.0)
MCV: 84.4 fL (ref 80.0–100.0)
MONOS PCT: 5 %
Monocytes Absolute: 0.7 10*3/uL (ref 0.2–0.9)
NEUTROS ABS: 9.9 10*3/uL — AB (ref 1.4–6.5)
NEUTROS PCT: 74 %
Platelets: 181 10*3/uL (ref 150–440)
RBC: 4.14 MIL/uL (ref 3.80–5.20)
RDW: 13.2 % (ref 11.5–14.5)
WBC: 13.4 10*3/uL — AB (ref 3.6–11.0)

## 2017-12-11 LAB — COMPREHENSIVE METABOLIC PANEL
ALBUMIN: 2.7 g/dL — AB (ref 3.5–5.0)
ALT: 28 U/L (ref 14–54)
AST: 26 U/L (ref 15–41)
Alkaline Phosphatase: 141 U/L — ABNORMAL HIGH (ref 38–126)
Anion gap: 10 (ref 5–15)
BILIRUBIN TOTAL: 0.5 mg/dL (ref 0.3–1.2)
BUN: <5 mg/dL — ABNORMAL LOW (ref 6–20)
CHLORIDE: 104 mmol/L (ref 101–111)
CO2: 20 mmol/L — AB (ref 22–32)
Calcium: 8.9 mg/dL (ref 8.9–10.3)
Creatinine, Ser: 0.33 mg/dL — ABNORMAL LOW (ref 0.44–1.00)
GFR calc Af Amer: >60 mL/min (ref >60)
GLUCOSE: 100 mg/dL — AB (ref 65–99)
POTASSIUM: 3.4 mmol/L — AB (ref 3.5–5.1)
Sodium: 134 mmol/L — ABNORMAL LOW (ref 135–145)
Total Protein: 7.1 g/dL (ref 6.5–8.1)

## 2017-12-11 MED ORDER — TERBUTALINE SULFATE 1 MG/ML IJ SOLN
0.2500 mg | Freq: Once | INTRAMUSCULAR | Status: AC
  Administered 2017-12-11: 0.25 mg via SUBCUTANEOUS
  Filled 2017-12-11: qty 1

## 2017-12-11 MED ORDER — LACTATED RINGERS IV SOLN
500.0000 mL | INTRAVENOUS | Status: DC | PRN
  Administered 2017-12-11: 500 mL via INTRAVENOUS

## 2017-12-11 MED ORDER — BETAMETHASONE SOD PHOS & ACET 6 (3-3) MG/ML IJ SUSP
12.0000 mg | INTRAMUSCULAR | Status: DC
  Administered 2017-12-11: 12 mg via INTRAMUSCULAR
  Filled 2017-12-11: qty 2

## 2017-12-11 NOTE — OB Triage Note (Signed)
Pt presents to L&D with c/o vaginal bleeding. Experienced "gush of blood" at 2100 at home. Pt with pad saturated. Toco and EFM applied. Will monitor. Pt denies decreased fetal movement, leaking of fluid. Pt contracting pain 5/10 every 3-5 mins per pt report. Pt reports cocaine use yesterday.

## 2017-12-12 DIAGNOSIS — E039 Hypothyroidism, unspecified: Secondary | ICD-10-CM | POA: Diagnosis present

## 2017-12-12 DIAGNOSIS — Z3A32 32 weeks gestation of pregnancy: Secondary | ICD-10-CM | POA: Diagnosis not present

## 2017-12-12 DIAGNOSIS — M069 Rheumatoid arthritis, unspecified: Secondary | ICD-10-CM | POA: Diagnosis present

## 2017-12-12 DIAGNOSIS — Z79899 Other long term (current) drug therapy: Secondary | ICD-10-CM | POA: Diagnosis not present

## 2017-12-12 DIAGNOSIS — F1410 Cocaine abuse, uncomplicated: Secondary | ICD-10-CM | POA: Diagnosis present

## 2017-12-12 DIAGNOSIS — O42913 Preterm premature rupture of membranes, unspecified as to length of time between rupture and onset of labor, third trimester: Secondary | ICD-10-CM | POA: Diagnosis present

## 2017-12-12 DIAGNOSIS — O99283 Endocrine, nutritional and metabolic diseases complicating pregnancy, third trimester: Secondary | ICD-10-CM | POA: Diagnosis present

## 2017-12-12 DIAGNOSIS — O9989 Other specified diseases and conditions complicating pregnancy, childbirth and the puerperium: Secondary | ICD-10-CM | POA: Diagnosis present

## 2017-12-12 DIAGNOSIS — O99323 Drug use complicating pregnancy, third trimester: Secondary | ICD-10-CM | POA: Diagnosis present

## 2017-12-12 LAB — URINALYSIS, ROUTINE W REFLEX MICROSCOPIC
BACTERIA UA: NONE SEEN
BILIRUBIN URINE: NEGATIVE
Glucose, UA: NEGATIVE mg/dL
KETONES UR: 20 mg/dL — AB
Nitrite: NEGATIVE
PH: 6 (ref 5.0–8.0)
Protein, ur: 30 mg/dL — AB
Specific Gravity, Urine: 1.019 (ref 1.005–1.030)

## 2017-12-12 LAB — PROTEIN / CREATININE RATIO, URINE
Creatinine, Urine: 104 mg/dL
Protein Creatinine Ratio: 0.18 mg/mg{Cre} — ABNORMAL HIGH (ref 0.00–0.15)
Total Protein, Urine: 19 mg/dL

## 2017-12-12 LAB — CBC WITH DIFFERENTIAL/PLATELET
BASOS ABS: 0 10*3/uL (ref 0–0.1)
Basophils Relative: 0 %
Eosinophils Absolute: 0 10*3/uL (ref 0–0.7)
Eosinophils Relative: 0 %
HEMATOCRIT: 34.2 % — AB (ref 35.0–47.0)
Hemoglobin: 11.7 g/dL — ABNORMAL LOW (ref 12.0–16.0)
LYMPHS ABS: 1.3 10*3/uL (ref 1.0–3.6)
LYMPHS PCT: 10 %
MCH: 29.2 pg (ref 26.0–34.0)
MCHC: 34.3 g/dL (ref 32.0–36.0)
MCV: 85.2 fL (ref 80.0–100.0)
MONO ABS: 0.3 10*3/uL (ref 0.2–0.9)
Monocytes Relative: 3 %
Neutro Abs: 12.1 10*3/uL — ABNORMAL HIGH (ref 1.4–6.5)
Neutrophils Relative %: 87 %
Platelets: 175 10*3/uL (ref 150–440)
RBC: 4.01 MIL/uL (ref 3.80–5.20)
RDW: 13.2 % (ref 11.5–14.5)
WBC: 13.8 10*3/uL — AB (ref 3.6–11.0)

## 2017-12-12 LAB — URINE DRUG SCREEN, QUALITATIVE (ARMC ONLY)
AMPHETAMINES, UR SCREEN: NOT DETECTED
AMPHETAMINES, UR SCREEN: NOT DETECTED
Benzodiazepine, Ur Scrn: NOT DETECTED
Benzodiazepine, Ur Scrn: NOT DETECTED
CANNABINOID 50 NG, UR ~~LOC~~: POSITIVE — AB
CANNABINOID 50 NG, UR ~~LOC~~: POSITIVE — AB
Cocaine Metabolite,Ur ~~LOC~~: POSITIVE — AB
Cocaine Metabolite,Ur ~~LOC~~: POSITIVE — AB
MDMA (Ecstasy)Ur Screen: NOT DETECTED
MDMA (Ecstasy)Ur Screen: NOT DETECTED
METHADONE SCREEN, URINE: NOT DETECTED
Methadone Scn, Ur: NOT DETECTED
OPIATE, UR SCREEN: NOT DETECTED
OPIATE, UR SCREEN: NOT DETECTED
PHENCYCLIDINE (PCP) UR S: NOT DETECTED
Phencyclidine (PCP) Ur S: NOT DETECTED
Tricyclic, Ur Screen: NOT DETECTED
Tricyclic, Ur Screen: NOT DETECTED

## 2017-12-12 LAB — CHLAMYDIA/NGC RT PCR (ARMC ONLY)
CHLAMYDIA TR: NOT DETECTED
N GONORRHOEAE: NOT DETECTED

## 2017-12-12 LAB — RAPID HIV SCREEN (HIV 1/2 AB+AG)
HIV 1/2 Antibodies: NONREACTIVE
HIV-1 P24 Antigen - HIV24: NONREACTIVE

## 2017-12-12 LAB — TYPE AND SCREEN
ABO/RH(D): A POS
Antibody Screen: NEGATIVE

## 2017-12-12 MED ORDER — AZITHROMYCIN 1 G PO PACK
1.0000 g | PACK | Freq: Once | ORAL | Status: AC
  Administered 2017-12-12: 1 g via ORAL
  Filled 2017-12-12: qty 1

## 2017-12-12 MED ORDER — LACTATED RINGERS IV SOLN
500.0000 mL | INTRAVENOUS | Status: DC | PRN
  Administered 2017-12-12: 1000 mL via INTRAVENOUS

## 2017-12-12 MED ORDER — AMOXICILLIN-POT CLAVULANATE 875-125 MG PO TABS
1.0000 | ORAL_TABLET | Freq: Two times a day (BID) | ORAL | Status: DC
  Administered 2017-12-12: 1 via ORAL
  Filled 2017-12-12: qty 1

## 2017-12-12 MED ORDER — SODIUM CHLORIDE 0.9 % IV SOLN
2.0000 g | Freq: Four times a day (QID) | INTRAVENOUS | Status: DC
  Administered 2017-12-12 (×2): 2 g via INTRAVENOUS
  Filled 2017-12-12 (×2): qty 2000

## 2017-12-12 NOTE — OB Triage Note (Signed)
Pt called RN in the room and mentioned that she was leaving and going to [facility] for care. She states that "[facility] terminated [facility] care in the third trimester and she isnt sure she wants them to deliver her baby" I called and spoke to [facility] and she states that she was sent a letter that if she didn't keep her appointments she was at risk for having her care terminated' The patient was informed of the risk for her and her baby if she leaves AMA, she verbalized she understood and that she was going to go home and get some clothes and go to [facility] for care. I advised her that she should seek medical attention immediately upon leaving AMA. Pt verbalized understanding of the risk and she decided to leave any ways.

## 2017-12-12 NOTE — Progress Notes (Addendum)
RN called to notify me that patient states that she is leaving to go to [facility] for care.  Nurse states that patient said that she was terminated from our practice. Explained to RN that she was not terminated from the practice. She was sent a letter explanting that she has missed and rescheduled several appointment and stress the importance of PNC. It was mentioned if she continues to not show up or reschedule then the practice would have the right to terminate her care with our practice .  RN instructed to inform patient that she would be leaving the hospital against medical advice and  of risk of PTL and delivery, infection, hemorrhage, death of fetus or herself. Pt encouraged to seek medical attention immediately. Dr. Logan Bores consulting MD notified.    Doreene Burke, CNM

## 2017-12-12 NOTE — Progress Notes (Signed)
FACULTY PRACTICE ANTEPARTUM COMPREHENSIVE PROGRESS NOTE  Kathy Walton is a 42 y.o. [phone] at 78w2dwho is admitted for vaginal bleeding and possible SROM.  Estimated Date of Delivery: 02/04/18 Fetal presentation is cephalic.  Length of Stay:  0 Days. Admitted 12/11/2017  Subjective:  Resting comfortably Patient reports good fetal movement.  She reports no uterine contractions   Vitals:  Blood pressure 110/81, pulse (!) 101, temperature 98.4 F (36.9 C), temperature source Oral, resp. rate 18, last menstrual period 04/11/2017, unknown if currently breastfeeding. Physical Examination: CONSTITUTIONAL: Well-developed, well-nourished female in no acute distress.  HENT:  Normocephalic, atraumatic, External right and left ear normal. Oropharynx is clear and moist SKIN: Skin is warm and dry. No rash noted. Not diaphoretic. No erythema. No pallor. [place] Alert and oriented to person, place, and time. Normal reflexes, muscle tone coordination. No cranial nerve deficit noted. CARDIOVASCULAR: Normal heart rate noted, regular rhythm RESPIRATORY: Effort and breath sounds normal, no problems with respiration noted MUSCULOSKELETAL: Normal range of motion. No edema and no tenderness. 2+ distal pulses. ABDOMEN: Soft, nontender, nondistended, gravid. CERVIX: Dilation: 1 Effacement (%): 50 Cervical Position: Posterior Exam by:: A TGrandville Silos CNM  Continues to leak fluid the color is not as bright red   Fetal monitoring: FHR: 145bpm, Variability: moderate, Accelerations: absent, Decelerations: occasional varable  Uterine activity: occasional contractions ,1-2 hr Results for orders placed or performed during the hospital encounter of 12/11/17 (from the past 48 hour(s))  CBC with Differential/Platelet     Status: Abnormal   Collection Time: 12/11/17  9:51 PM  Result Value Ref Range   WBC 13.4 (H) 3.6 - 11.0 K/uL   RBC 4.14 3.80 - 5.20 MIL/uL   Hemoglobin 12.5 12.0 - 16.0 g/dL   HCT 35.0 35.0 - 47.0 %    MCV 84.4 80.0 - 100.0 fL   MCH 30.3 26.0 - 34.0 pg   MCHC 35.9 32.0 - 36.0 g/dL   RDW 13.2 11.5 - 14.5 %   Platelets 181 150 - 440 K/uL   Neutrophils Relative % 74 %   Neutro Abs 9.9 (H) 1.4 - 6.5 K/uL   Lymphocytes Relative 18 %   Lymphs Abs 2.4 1.0 - 3.6 K/uL   Monocytes Relative 5 %   Monocytes Absolute 0.7 0.2 - 0.9 K/uL   Eosinophils Relative 2 %   Eosinophils Absolute 0.3 0 - 0.7 K/uL   Basophils Relative 1 %   Basophils Absolute 0.1 0 - 0.1 K/uL    Comment: [facility]formed at [facility] [address], [place] [address] Type and screen AP[place]    Status: None (Preliminary result)   Collection Time: 12/11/17  9:51 PM  Result Value Ref Range   ABO/RH(D) PENDING    Antibody Screen PENDING    Sample Expiration      12/14/2017 Performed at [facility] [address], [place] [address]  Comprehensive metabolic panel     Status: Abnormal   Collection Time: 12/11/17  9:51 PM  Result Value Ref Range   Sodium 134 (L) 135 - 145 mmol/L   Potassium 3.4 (L) 3.5 - 5.1 mmol/L   Chloride 104 101 - 111 mmol/L   CO2 20 (L) 22 - 32 mmol/L   Glucose, Bld 100 (H) 65 - 99 mg/dL   BUN <5 (L) 6 - 20 mg/dL   Creatinine, Ser 0.33 (L) 0.44 - 1.00 mg/dL   Calcium 8.9 8.9 - 10.3 mg/dL   Total Protein  7.1 6.5 - 8.1 g/dL   Albumin 2.7 (L) 3.5 - 5.0 g/dL   AST 26 15 - 41 U/L   ALT 28 14 - 54 U/L   Alkaline Phosphatase 141 (H) 38 - 126 U/L   Total Bilirubin 0.5 0.3 - 1.2 mg/dL   GFR calc non Af Amer >60 >60 mL/min   GFR calc Af Amer >60 >60 mL/min    Comment: (NOTE) The eGFR has been calculated using the CKD EPI equation. This calculation has not been validated in all clinical situations. eGFR's persistently <60 mL/min signify possible Chronic Kidney Disease.    Anion gap 10 5 - 15    Comment: Performed at [facility], [address] [place], [place] [address]  Type and screen     Status: None   Collection Time:  12/11/17 10:47 PM  Result Value Ref Range   ABO/RH(D) A POS    Antibody Screen NEG    Sample Expiration      12/14/2017 Performed at [facility], [place], [place] [address]   Rapid HIV screen (HIV 1/2 Ab+Ag) ([facility] Only)     Status: None   Collection Time: 12/11/17 10:47 PM  Result Value Ref Range   HIV-1 P24 Antigen - HIV24 NON REACTIVE NON REACTIVE   HIV 1/2 Antibodies NON REACTIVE NON REACTIVE   Interpretation (HIV Ag Ab)      A non reactive test result means that HIV 1 or HIV 2 antibodies and HIV 1 p24 antigen were not detected in the specimen.    Comment: Performed at [facility], [address] [place], [place] [address]  Protein / creatinine ratio, urine     Status: Abnormal   Collection Time: 12/12/17  2:07 AM  Result Value Ref Range   Creatinine, Urine 104 mg/dL   Total Protein, Urine 19 mg/dL    Comment: NO NORMAL RANGE ESTABLISHED FOR THIS TEST   Protein Creatinine Ratio 0.18 (H) 0.00 - 0.15 mg/mg[Cre]    Comment: Performed at [facility] [address], [place],  [address]  Urine Drug Screen, Qualitative ([place] only)     Status: Abnormal   Collection Time: 12/12/17  2:07 AM  Result Value Ref Range   Tricyclic, Ur Screen NONE DETECTED NONE DETECTED   Amphetamines, Ur Screen NONE DETECTED NONE DETECTED   MDMA (Ecstasy)Ur Screen NONE DETECTED NONE DETECTED   Cocaine Metabolite,Ur [place] POSITIVE (A) NONE DETECTED   Opiate, Ur Screen NONE DETECTED NONE DETECTED   Phencyclidine (PCP) Ur S NONE DETECTED NONE DETECTED   Cannabinoid 50 Ng, Ur [place] POSITIVE (A) NONE DETECTED   Barbiturates, Ur Screen (A) NONE DETECTED    Result not available. Reagent lot number recalled by manufacturer.   Benzodiazepine, Ur Scrn NONE DETECTED NONE DETECTED   Methadone Scn, Ur NONE DETECTED NONE DETECTED    Comment: (NOTE) Tricyclics + metabolites, urine    Cutoff 1000 ng/mL Amphetamines + metabolites, urine  Cutoff 1000 ng/mL MDMA  (Ecstasy), urine              Cutoff 500 ng/mL Cocaine Metabolite, urine          Cutoff 300 ng/mL Opiate + metabolites, urine        Cutoff 300 ng/mL Phencyclidine (PCP), urine         Cutoff 25 ng/mL Cannabinoid, urine                 Cutoff 50 ng/mL Barbiturates + metabolites, urine  Cutoff 200 ng/mL Benzodiazepine, urine              Cutoff 200 ng/mL Methadone, urine                   Cutoff 300 ng/mL The urine drug screen provides only a preliminary, unconfirmed analytical test result and should not be used for non-medical purposes. Clinical consideration and professional judgment should be applied to any positive drug screen result due to possible interfering substances. A more specific alternate chemical method must be used in order to obtain a confirmed analytical result. Gas chromatography / mass spectrometry (GC/MS) is the preferred confirmat ory method. Performed at [facility], [address] [place], [place] [address]   [place] rt PCR [facility] only)     Status: None   Collection Time: 12/12/17  2:16 AM  Result Value Ref Range   Specimen source GC/Chlam URINE, RANDOM    Chlamydia Tr NOT DETECTED NOT DETECTED   N gonorrhoeae NOT DETECTED NOT DETECTED    Comment: (NOTE) 100  This methodology has not been evaluated in pregnant women or in 200  patients with a history of hysterectomy. 300 400  This methodology will not be performed on patients less than 10  years of age. Performed at [facility], [address] [place], [place] [address]   CBC with Differential/Platelet     Status: Abnormal   Collection Time: 12/12/17  8:29 AM  Result Value Ref Range   WBC 13.8 (H) 3.6 - 11.0 K/uL   RBC 4.01 3.80 - 5.20 MIL/uL   Hemoglobin 11.7 (L) 12.0 - 16.0 g/dL   HCT 34.2 (L) 35.0 - 47.0 %   MCV 85.2 80.0 - 100.0 fL   MCH 29.2 26.0 - 34.0 pg   MCHC 34.3 32.0 - 36.0 g/dL   RDW 13.2 11.5 - 14.5 %   Platelets 175 150 - 440 K/uL   Neutrophils Relative  % 87 %   Neutro Abs 12.1 (H) 1.4 - 6.5 K/uL   Lymphocytes Relative 10 %   Lymphs Abs 1.3 1.0 - 3.6 K/uL   Monocytes Relative 3 %   Monocytes Absolute 0.3 0.2 - 0.9 K/uL   Eosinophils Relative 0 %   Eosinophils Absolute 0.0 0 - 0.7 K/uL   Basophils Relative 0 %   Basophils Absolute 0.0 0 - 0.1 K/uL    Comment: Performed at [facility], [address] [place], [place] [address]    US Ob Comp + 14 Wk  Result Date: 12/12/2017 CLINICAL DATA:  42 year old pregnant female presenting with vaginal bleeding. Concern for abruption. Gestational age by LMP 34 weeks, 6 days. EXAM: OBSTETRICAL ULTRASOUND >14 WKS FINDINGS: Number of Fetuses: 1 Heart Rate:  153 bpm Movement: Limited movement visualized Presentation: Cephalic Previa: No Placental Location: Posterior fundal Amniotic Fluid (Subjective): Decreased Amniotic Fluid (Objective): Vertical pocket 1.1 cm in the right lower quadrant and 2.3 cm in the right upper quadrant AFI 6.8 cm (5%ile= 8.1 cm, 95%= 24.8 cm for 34 wks) FETAL BIOMETRY BPD:  8.5cm 34w 1d HC:    31.3cm 35w 1d AC:   30.5cm 34w 3d FL:   6.2cm 32w tod Current Mean GA: 34w 0d [place] EDC: 01/22/2018 Estimated Fetal Weight: 2,297g 21%ile (previously 358 g corresponding to 9 percentile on prior ultrasound). FETAL ANATOMY Evaluation of the fetal anatomy is very limited on the provided images. A three-vessel cord, and the urinary bladder noted. There is limited visualization of the kidney and intracranial  structures. Technically difficult due to: Advanced gestational age Maternal Findings: Cervix:  The ovaries are not visualized. IMPRESSION: 1. Single live intrauterine pregnancy with an estimated gestational age of [redacted] weeks, 0 days. No acute abnormality identified. 2. Limited and nondiagnostic evaluation of the fetal anatomy. 3. Decreased amniotic fluid index. Electronically Signed   By: Anner Crete M.D.   On: 12/12/2017 01:53    Current scheduled medications . [START ON 12/14/2017]  amoxicillin-clavulanate  1 tablet Oral Q12H  . betamethasone acetate-betamethasone sodium phosphate  12 mg Intramuscular Q24 Hr x 2    I have reviewed the patient's current medications.  ASSESSMENT: Active Problems:   Labor and delivery, indication for care Vaginal bleeding  PLAN: Second dose of betamethasone today Continue PPROM antibiotic therapy Regular diet   Continue routine antenatal care.
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
## 2017-12-13 ENCOUNTER — Telehealth: Payer: Self-pay | Admitting: Certified Nurse Midwife

## 2017-12-13 LAB — RPR: RPR: NONREACTIVE

## 2017-12-13 NOTE — Telephone Encounter (Signed)
The patient has called x3 today; last call at 2:40 PM; stating she has not received an Rx for her antibiotic from the ED visit last night.  She is becoming agitated and would like a call back at her contact number of [phone], please advise, thank you.

## 2017-12-13 NOTE — Telephone Encounter (Signed)
The patient called and stated that she would like to speak with her nurse or her provider in regards to her medication and ED visit. No other information was disclosed. Please a.dvise.

## 2017-12-14 ENCOUNTER — Telehealth: Payer: Self-pay

## 2017-12-14 ENCOUNTER — Encounter (INDEPENDENT_AMBULATORY_CARE_PROVIDER_SITE_OTHER): Payer: Self-pay

## 2017-12-14 LAB — CULTURE, BETA STREP (GROUP B ONLY)

## 2017-12-14 NOTE — Telephone Encounter (Signed)
I have attempted a telephone call with pt., but was unable to leave a voicemail message.  I also sent pt. A my chart message.

## 2017-12-14 NOTE — Telephone Encounter (Signed)
CM attempted a tc with member.  Unable to leave voicemail message.  Mailbox is full.

## 2018-04-29 ENCOUNTER — Encounter (HOSPITAL_COMMUNITY): Payer: Self-pay

## 2019-06-10 IMAGING — US US OB COMP +14 WK
1 series · 13 of 28 positions shown · non-contrast
Comparison: none

CLINICAL DATA: 42-year-old pregnant female presenting with vaginal
bleeding. Concern for abruption. Gestational age by LMP 34 weeks, 6
days.

EXAM:
OBSTETRICAL ULTRASOUND >14 WKS

[Series 1: us ob comp +14 wk · 0.31mm/px · 13 of 36 slices shown]
[im 2/36]
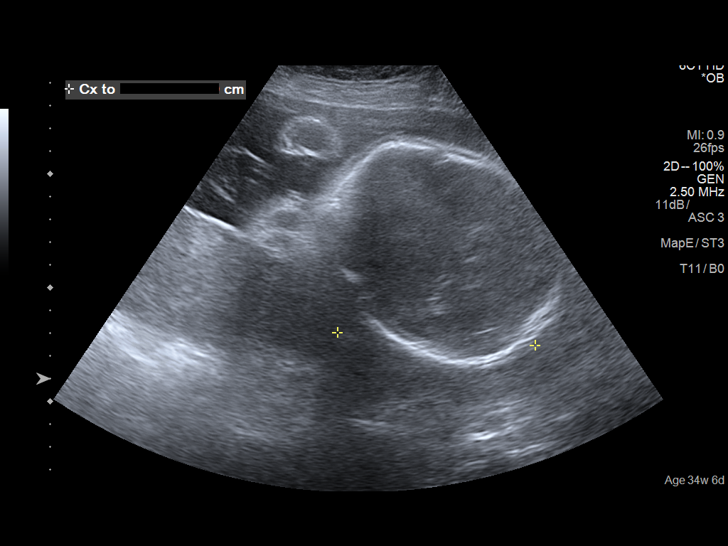
[im 4/36]
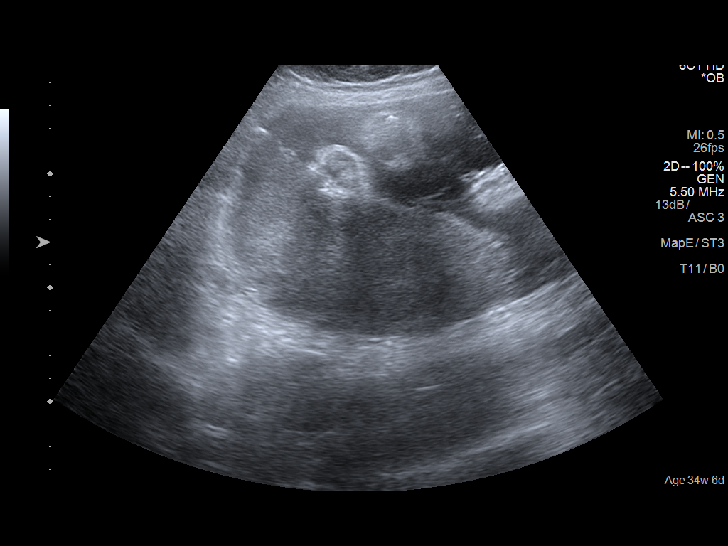
[im 7/36]
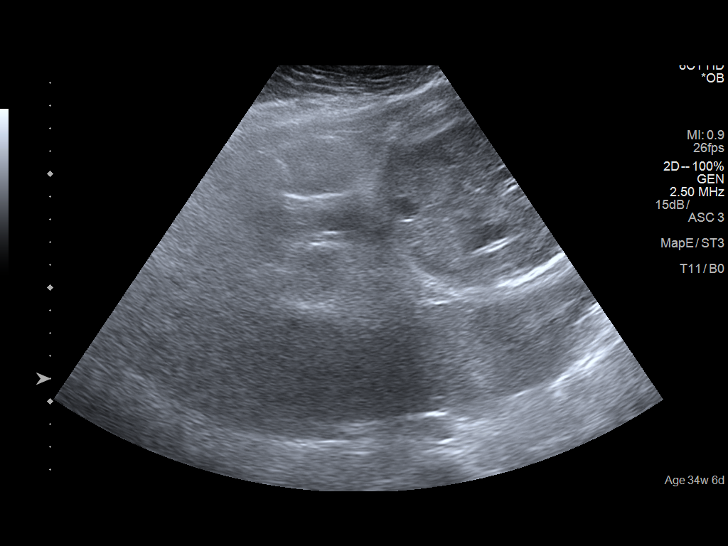
[im 10/36]
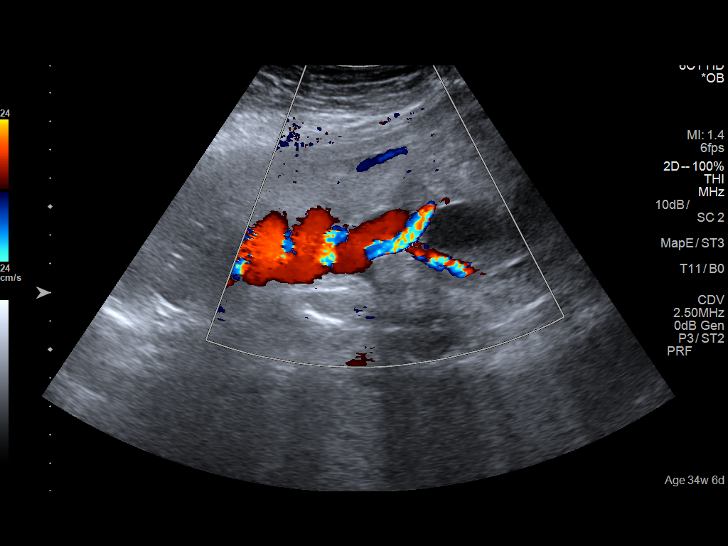
[im 12/36]
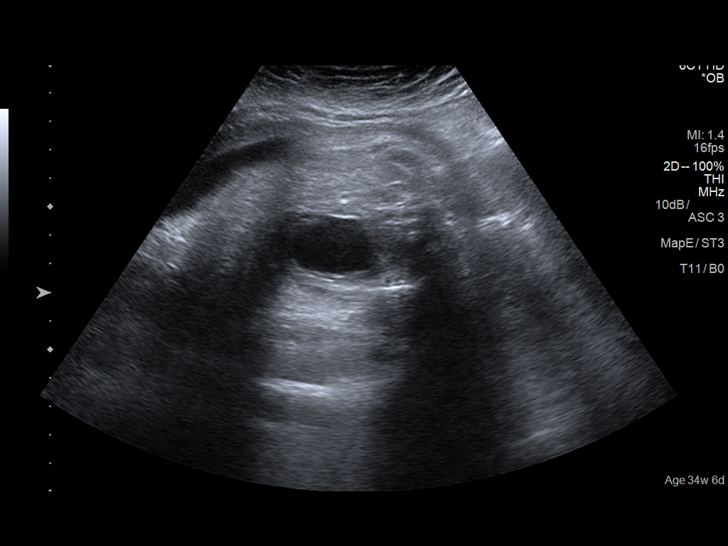
[im 15/36]
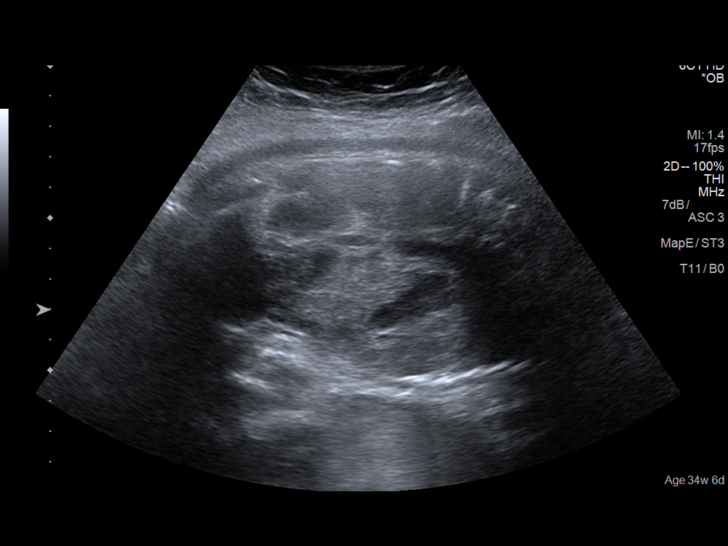
[im 19/36]
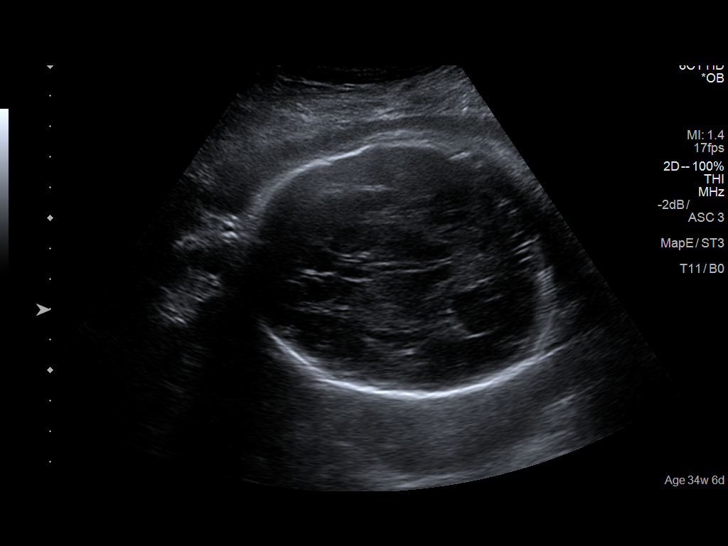
[im 21/36]
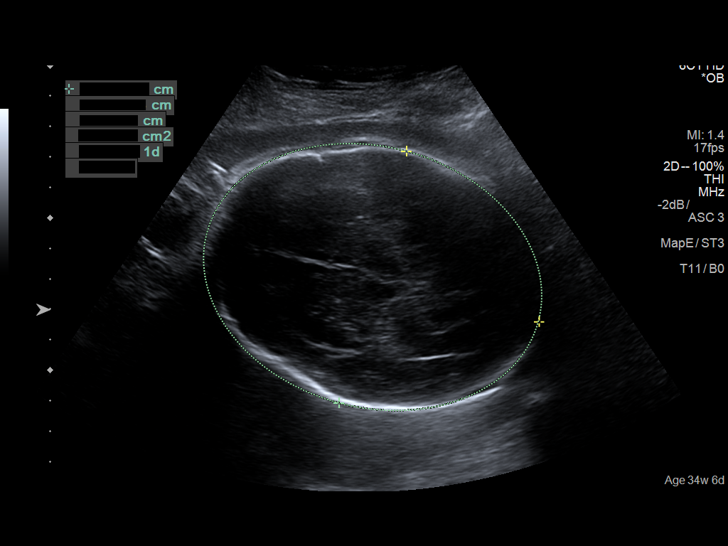
[im 24/36]
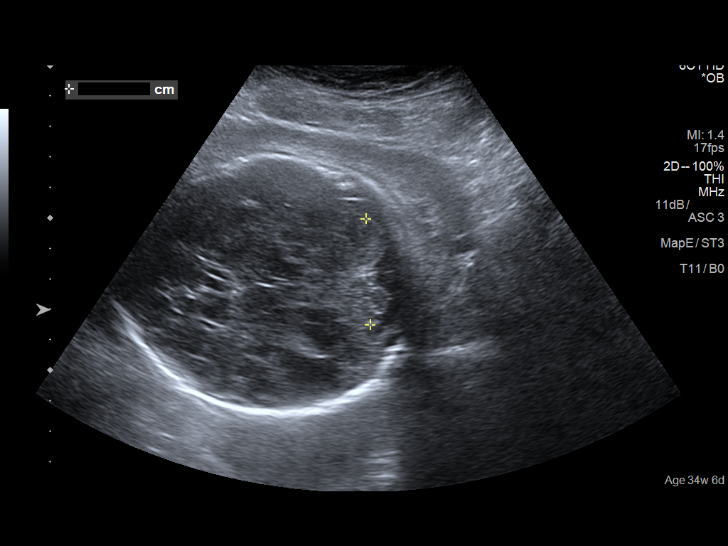
[im 26/36]
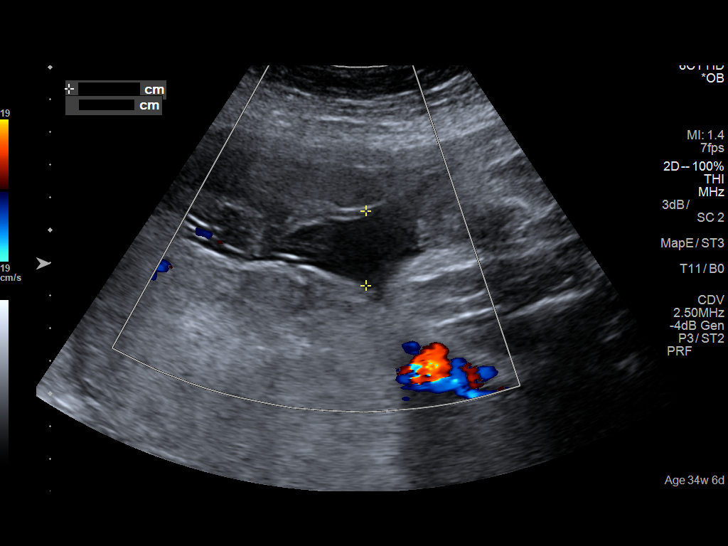
[im 29/36]
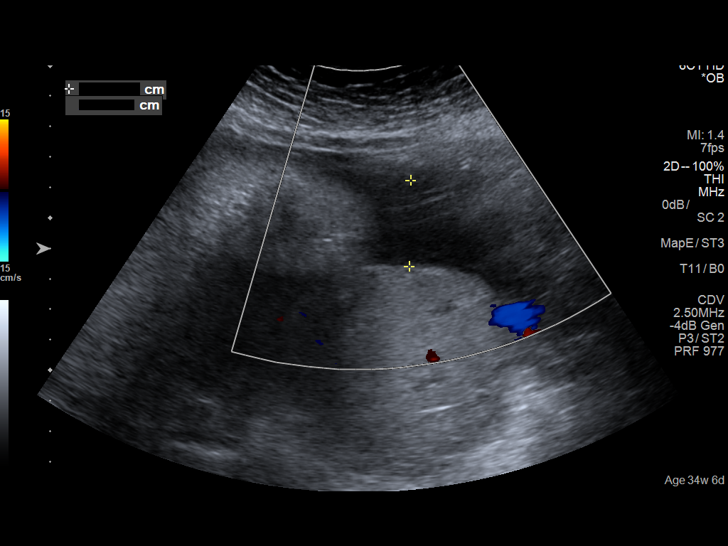
[im 32/36]
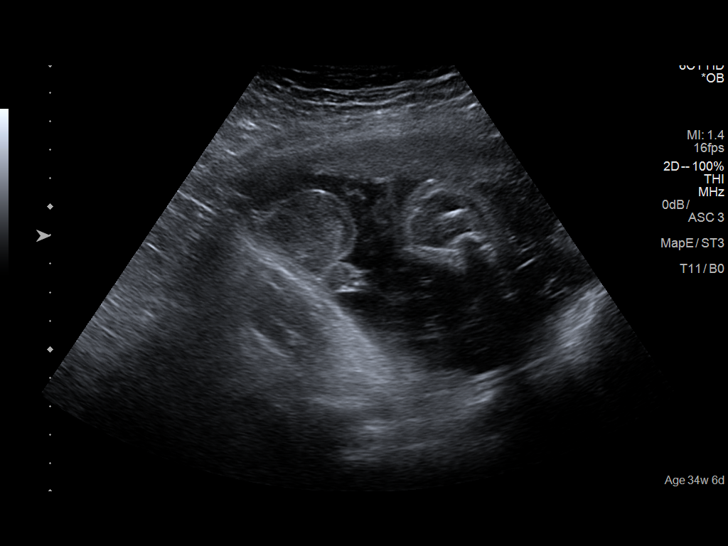
[im 34/36]
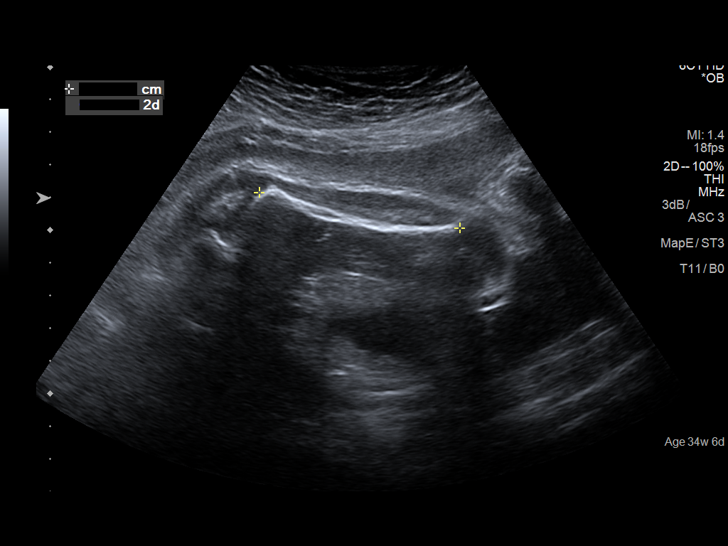

[13 of 28 positions shown; findings below may reference images not displayed]

FINDINGS: Number of Fetuses: 1

Heart Rate:  153 bpm

Movement: Limited movement visualized

Presentation: Cephalic

Previa: No

Placental Location: Posterior fundal

Amniotic Fluid (Subjective): Decreased

Amniotic Fluid (Objective):

Vertical pocket 1.1 cm in the right lower quadrant and 2.3 cm in the
right upper quadrant

AFI 6.8 cm (5%ile= 8.1 cm, 95%= 24.8 cm for 34 wks)

FETAL BIOMETRY

BPD:  8.5cm 34w 1d

HC:    31.3cm 35w 1d

AC:   30.5cm 34w 3d

FL:   6.2cm 32Eri Zach

Current Mean GA: 34w 0d US EDC: 01/22/2018

Estimated Fetal Weight: 2,297g 21%ile (previously 358 g
corresponding to 9 percentile on prior ultrasound).

FETAL ANATOMY

Evaluation of the fetal anatomy is very limited on the provided
images.

A three-vessel cord, and the urinary bladder noted. There is limited
visualization of the kidney and intracranial structures.

Technically difficult due to: Advanced gestational age

Maternal Findings:

Cervix:  The ovaries are not visualized.
IMPRESSION: 1. Single live intrauterine pregnancy with an estimated gestational
age of 34 weeks, 0 days. No acute abnormality identified.
2. Limited and nondiagnostic evaluation of the fetal anatomy.
3. Decreased amniotic fluid index.

## 2019-12-16 ENCOUNTER — Encounter: Payer: Self-pay | Admitting: Emergency Medicine

## 2019-12-16 ENCOUNTER — Emergency Department
Admission: EM | Admit: 2019-12-16 | Discharge: 2019-12-16 | Discharge status: Against Medical Advice | Payer: Self-pay | Attending: Emergency Medicine | Admitting: Emergency Medicine

## 2019-12-16 ENCOUNTER — Other Ambulatory Visit: Payer: Self-pay

## 2019-12-16 DIAGNOSIS — Z87891 Personal history of nicotine dependence: Secondary | ICD-10-CM | POA: Insufficient documentation

## 2019-12-16 DIAGNOSIS — Z881 Allergy status to other antibiotic agents status: Secondary | ICD-10-CM | POA: Insufficient documentation

## 2019-12-16 DIAGNOSIS — Y939 Activity, unspecified: Secondary | ICD-10-CM | POA: Insufficient documentation

## 2019-12-16 DIAGNOSIS — F1910 Other psychoactive substance abuse, uncomplicated: Secondary | ICD-10-CM | POA: Insufficient documentation

## 2019-12-16 DIAGNOSIS — Y929 Unspecified place or not applicable: Secondary | ICD-10-CM | POA: Insufficient documentation

## 2019-12-16 DIAGNOSIS — T401X1A Poisoning by heroin, accidental (unintentional), initial encounter: Secondary | ICD-10-CM | POA: Insufficient documentation

## 2019-12-16 DIAGNOSIS — Y999 Unspecified external cause status: Secondary | ICD-10-CM | POA: Insufficient documentation

## 2019-12-16 DIAGNOSIS — X58XXXA Exposure to other specified factors, initial encounter: Secondary | ICD-10-CM | POA: Insufficient documentation

## 2019-12-16 DIAGNOSIS — E039 Hypothyroidism, unspecified: Secondary | ICD-10-CM | POA: Insufficient documentation

## 2019-12-16 MED ORDER — NALOXONE HCL 4 MG/0.1ML NA LIQD: NASAL | 0 refills | Status: DC

## 2019-12-16 NOTE — ED Triage Notes (Signed)
Pt reports she just overdosed and feels fine. Pt states has never happened before. Pt appears pale and diaphoretic. Pt reports she wants to leave. MD informed of pts intention to leave.
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
## 2019-12-16 NOTE — ED Triage Notes (Signed)
Pt in via EMS from home with c/o a heroin overdose. Pt reports usually smokes crack but this time snorted heroin. Pt received 5mg  of narcan prior to arrival to the ED. Was given 4mg  by friends and 1mg  by EMS  105HR, 96%RA, 145/95

## 2021-11-25 ENCOUNTER — Emergency Department: Payer: Self-pay

## 2021-11-25 ENCOUNTER — Emergency Department
Admission: EM | Admit: 2021-11-25 | Discharge: 2021-11-25 | Disposition: A | Discharge status: Home / Self Care | Payer: Self-pay | Attending: Emergency Medicine | Admitting: Emergency Medicine

## 2021-11-25 DIAGNOSIS — N135 Crossing vessel and stricture of ureter without hydronephrosis: Secondary | ICD-10-CM

## 2021-11-25 DIAGNOSIS — N132 Hydronephrosis with renal and ureteral calculous obstruction: Secondary | ICD-10-CM | POA: Insufficient documentation

## 2021-11-25 DIAGNOSIS — N3090 Cystitis, unspecified without hematuria: Secondary | ICD-10-CM | POA: Insufficient documentation

## 2021-11-25 LAB — COMPREHENSIVE METABOLIC PANEL
ALT: 14 U/L (ref 0–44)
AST: 15 U/L (ref 15–41)
Albumin: 3.4 g/dL — ABNORMAL LOW (ref 3.5–5.0)
Alkaline Phosphatase: 82 U/L (ref 38–126)
Anion gap: 8 (ref 5–15)
BUN: 14 mg/dL (ref 6–20)
CO2: 23 mmol/L (ref 22–32)
Calcium: 8.6 mg/dL — ABNORMAL LOW (ref 8.9–10.3)
Chloride: 102 mmol/L (ref 98–111)
Creatinine, Ser: 0.86 mg/dL (ref 0.44–1.00)
GFR, Estimated: >60 mL/min (ref >60)
Glucose, Bld: 186 mg/dL — ABNORMAL HIGH (ref 70–99)
Potassium: 2.9 mmol/L — ABNORMAL LOW (ref 3.5–5.1)
Sodium: 133 mmol/L — ABNORMAL LOW (ref 135–145)
Total Bilirubin: 0.6 mg/dL (ref 0.3–1.2)
Total Protein: 8.6 g/dL — ABNORMAL HIGH (ref 6.5–8.1)

## 2021-11-25 LAB — URINALYSIS, ROUTINE W REFLEX MICROSCOPIC
Bilirubin Urine: NEGATIVE
Glucose, UA: NEGATIVE mg/dL
Ketones, ur: NEGATIVE mg/dL
Nitrite: NEGATIVE
Protein, ur: NEGATIVE mg/dL
Specific Gravity, Urine: 1.005 (ref 1.005–1.030)
WBC, UA: >50 WBC/hpf — ABNORMAL HIGH (ref 0–5)
pH: 6 (ref 5.0–8.0)

## 2021-11-25 LAB — CBC WITH DIFFERENTIAL/PLATELET
Abs Immature Granulocytes: 0.04 10*3/uL (ref 0.00–0.07)
Basophils Absolute: 0 10*3/uL (ref 0.0–0.1)
Basophils Relative: 0 %
Eosinophils Absolute: 0.1 10*3/uL (ref 0.0–0.5)
Eosinophils Relative: 1 %
HCT: 37.4 % (ref 36.0–46.0)
Hemoglobin: 12.3 g/dL (ref 12.0–15.0)
Immature Granulocytes: 0 %
Lymphocytes Relative: 19 %
Lymphs Abs: 2 10*3/uL (ref 0.7–4.0)
MCH: 27.9 pg (ref 26.0–34.0)
MCHC: 32.9 g/dL (ref 30.0–36.0)
MCV: 84.8 fL (ref 80.0–100.0)
Monocytes Absolute: 0.9 10*3/uL (ref 0.1–1.0)
Monocytes Relative: 9 %
Neutro Abs: 7.2 10*3/uL (ref 1.7–7.7)
Neutrophils Relative %: 71 %
Platelets: 333 10*3/uL (ref 150–400)
RBC: 4.41 MIL/uL (ref 3.87–5.11)
RDW: 12.5 % (ref 11.5–15.5)
WBC: 10.3 10*3/uL (ref 4.0–10.5)
nRBC: 0 % (ref 0.0–0.2)

## 2021-11-25 LAB — PREGNANCY, URINE: Preg Test, Ur: NEGATIVE

## 2021-11-25 LAB — LACTIC ACID, PLASMA: Lactic Acid, Venous: 1.6 mmol/L (ref 0.5–1.9)

## 2021-11-25 MED ORDER — OXYCODONE-ACETAMINOPHEN 5-325 MG PO TABS: 1.0000 | ORAL_TABLET | Freq: Four times a day (QID) | ORAL | 0 refills | Status: AC | PRN

## 2021-11-25 MED ORDER — HYDROMORPHONE HCL 1 MG/ML IJ SOLN
1.0000 mg | Freq: Once | INTRAMUSCULAR | Status: AC
  Administered 2021-11-25: 1 mg via INTRAVENOUS
  Filled 2021-11-25: qty 1

## 2021-11-25 MED ORDER — CIPROFLOXACIN IN D5W 400 MG/200ML IV SOLN
400.0000 mg | Freq: Once | INTRAVENOUS | Status: AC
  Administered 2021-11-25: 400 mg via INTRAVENOUS
  Filled 2021-11-25: qty 200

## 2021-11-25 MED ORDER — SODIUM CHLORIDE 0.9 % IV BOLUS
1000.0000 mL | Freq: Once | INTRAVENOUS | Status: AC
  Administered 2021-11-25: 1000 mL via INTRAVENOUS

## 2021-11-25 MED ORDER — ONDANSETRON HCL 4 MG/2ML IJ SOLN
4.0000 mg | Freq: Once | INTRAMUSCULAR | Status: AC
  Administered 2021-11-25: 4 mg via INTRAVENOUS
  Filled 2021-11-25: qty 2

## 2021-11-25 MED ORDER — CIPROFLOXACIN HCL 500 MG PO TABS: 500.0000 mg | ORAL_TABLET | Freq: Two times a day (BID) | ORAL | 0 refills | Status: AC

## 2021-11-25 MED ORDER — ONDANSETRON 4 MG PO TBDP: 4.0000 mg | ORAL_TABLET | Freq: Three times a day (TID) | ORAL | 0 refills | Status: AC | PRN

## 2021-11-25 NOTE — ED Provider Notes (Signed)
[facility] Provider Note    Event Date/Time   First MD Initiated Contact with Patient 11/25/21 [phone]     (approximate)   History   Chief Complaint: Right flank pain  HPI  Kathy Walton is a 46 y.o. female with a past history of substance abuse, rheumatoid arthritis, ureteral stent placed 2 years ago who comes ED complaining of right flank pain radiating around to the right lower quadrant abdomen for the past week, waxing waning, currently severe.  Associated with fever at home.  No vomiting or diarrhea.  No aggravating or alleviating factors.  Feels like aggravation of chronic pain she has had since stent placement.  Reviewed outside records including prior hospitalizations and surgeries at [facility] and [facility], no mention of any ureteral stent placement or urological interventions.  She did have vaginal delivery in 2019     Physical Exam   Triage Vital Signs: ED Triage Vitals  Enc Vitals Group     BP      Pulse      Resp      Temp      Temp src      SpO2      Weight      Height      Head Circumference      Peak Flow      Pain Score      Pain Loc      Pain Edu?      Excl. in GC?     Most recent vital signs: Vitals:   11/25/21 0230 11/25/21 0300  BP:  127/64  Pulse:  97  Resp:  (!) 27  Temp: 98.6 F (37 C)   SpO2:  97%    General: Awake, no distress.  CV:  Good peripheral perfusion.  Tachycardia heart rate 105 Resp:  Normal effort.  Clear to auscultation bilateral Abd:  No distention.  Soft with diffuse right-sided tenderness Other:  Moist mucosa, no rash.   ED Results / Procedures / Treatments   Labs (all labs ordered are listed, but only abnormal results are displayed) Labs Reviewed  COMPREHENSIVE METABOLIC PANEL - Abnormal; Notable for the following components:      Result Value   Sodium 133 (*)    Potassium 2.9 (*)    Glucose, Bld 186 (*)    Calcium 8.6 (*)    Total Protein 8.6 (*)    Albumin  3.4 (*)    All other components within normal limits  URINALYSIS, ROUTINE W REFLEX MICROSCOPIC - Abnormal; Notable for the following components:   Color, Urine YELLOW (*)    APPearance CLOUDY (*)    Hgb urine dipstick MODERATE (*)    Leukocytes,Ua LARGE (*)    WBC, UA >50 (*)    Bacteria, UA MANY (*)    All other components within normal limits  CULTURE, BLOOD (ROUTINE X 2) W REFLEX TO ID PANEL  CULTURE, BLOOD (ROUTINE X 2) W REFLEX TO ID PANEL  URINE CULTURE  CBC WITH DIFFERENTIAL/PLATELET  LACTIC ACID, PLASMA  PREGNANCY, URINE  POC URINE PREG, ED     EKG    RADIOLOGY CT abdomen pelvis viewed and interpreted by me, shows extensive calcification in the right ureter with hydronephrosis.  Radiology report reviewed.   PROCEDURES:  Procedures   MEDICATIONS ORDERED IN ED: Medications  HYDROmorphone (DILAUDID) injection 1 mg (has no administration in time range)  sodium chloride 0.9 % bolus 1,000 mL (0 mLs Intravenous Stopped  11/25/21 0524)  HYDROmorphone (DILAUDID) injection 1 mg (1 mg Intravenous Given 11/25/21 0305)  ondansetron (ZOFRAN) injection 4 mg (4 mg Intravenous Given 11/25/21 0306)  ciprofloxacin (CIPRO) IVPB 400 mg (0 mg Intravenous Stopped 11/25/21 0524)     IMPRESSION / MDM / ASSESSMENT AND PLAN / ED COURSE  I reviewed the triage vital signs and the nursing notes.                              Differential diagnosis includes, but is not limited to, appendicitis, ureterolithiasis, ureteral stent obstruction, cystitis, pyelonephritis, constipation  Patient's presentation is most consistent with acute presentation with potential threat to life or bodily function.  Patient presents with right flank pain, reporting fever at home which is not present in the ED, reporting retained ureteral stent from placement 2 years ago.  Not peritoneal, but does have significant right-sided tenderness.  She is status post cholecystectomy.  Will check labs, urinalysis, CT abdomen  pelvis.  IV fluids for hydration and IV Dilaudid for pain relief.    ----------------------------------------- 6:10 AM on 11/25/2021 ----------------------------------------- Urinalysis suggestive of UTI.  Creatinine is normal, white blood cell count is normal, vital signs are normal.  She is not septic.  She is feeling better after pain control.  She wants to eat and drink.  CT scan shows and crusting of her right stent with hydronephrosis.  Discussed with urology Dr. Apolinar Junes who notes that this is a chronic process, patient can follow-up in office since she has no signs of sepsis and is nontoxic to plan stent removal.  Agrees with starting antibiotics in the meantime.  Patient counseled on the importance of following up in clinic in the next 1 or 2 days due to increased risks of developing bladder cancer because of the encrusted stent or losing the kidney.  Dr. Apolinar Junes has offered to see the patient in clinic today, but patient is confident that she will be able to follow-up with [facility] urology who has managed her in the past today or tomorrow     FINAL CLINICAL IMPRESSION(S) / ED DIAGNOSES   Final diagnoses:  Cystitis  Obstruction of right ureter     Rx / DC Orders   ED Discharge Orders          Ordered    ciprofloxacin (CIPRO) 500 MG tablet  2 times daily        11/25/21 0606    oxyCODONE-acetaminophen (PERCOCET) 5-325 MG tablet  Every 6 hours PRN        11/25/21 0606    ondansetron (ZOFRAN-ODT) 4 MG disintegrating tablet  Every 8 hours PRN        11/25/21 0606             Note:  This document was prepared using Dragon voice recognition software and may include unintentional dictation errors.   Sharman Cheek, MD 11/25/21 [phone]

## 2021-11-25 NOTE — ED Triage Notes (Signed)
Pt states she is having severe bladder and flank pain. States she has had UTI symptoms for the past year. Had a ureter stent placed 2 years prior but never had it removed.

## 2021-11-26 LAB — CULTURE, BLOOD (ROUTINE X 2): Special Requests: ADEQUATE

## 2021-11-26 LAB — URINE CULTURE: Culture: NO GROWTH

## 2021-11-28 LAB — CULTURE, BLOOD (ROUTINE X 2)

## 2021-11-30 LAB — CULTURE, BLOOD (ROUTINE X 2)
Culture: NO GROWTH
Culture: NO GROWTH

## 2022-02-16 ENCOUNTER — Telehealth: Payer: Self-pay | Admitting: Family Medicine

## 2022-02-16 DIAGNOSIS — R300 Dysuria: Secondary | ICD-10-CM

## 2022-02-16 NOTE — Patient Instructions (Signed)
Please be seen in the nearest ED for care to make sure your kidney is not developing trouble with stent

## 2022-02-16 NOTE — Progress Notes (Signed)
Kathy Walton   Needs to be seen in person given the duration of 2 years on a ureteral stent.  Was seen in ED in June for UTI and CT showed crusting of her right stent with hydronephrosis.  This was discussed with urology, Dr. Apolinar Junes who notes that this is a chronic process, patient can follow-up in office since she has no signs of sepsis and is nontoxic to plan stent removal.  Agrees with starting antibiotics in the meantime. She declined a same day appt with Dr Apolinar Junes, and reported she would call [facility] urology to follow up with them   She reports back today for UTI S&S.  I have encouraged her to be seen in person given the previous changes on CT and still not having followed up with urology for stent removal.  Patient acknowledged agreement and understanding of the plan.  She reports she will call [facility] about this.

## 2022-09-29 DIAGNOSIS — N131 Hydronephrosis with ureteral stricture, not elsewhere classified: Secondary | ICD-10-CM | POA: Diagnosis not present

## 2022-09-29 DIAGNOSIS — M25562 Pain in left knee: Secondary | ICD-10-CM | POA: Diagnosis not present

## 2022-09-29 DIAGNOSIS — S3993XA Unspecified injury of pelvis, initial encounter: Secondary | ICD-10-CM | POA: Diagnosis not present

## 2022-09-29 DIAGNOSIS — N1330 Unspecified hydronephrosis: Secondary | ICD-10-CM | POA: Diagnosis not present

## 2022-09-29 DIAGNOSIS — R0902 Hypoxemia: Secondary | ICD-10-CM | POA: Diagnosis not present

## 2022-09-29 DIAGNOSIS — G8911 Acute pain due to trauma: Secondary | ICD-10-CM | POA: Diagnosis not present

## 2022-09-29 DIAGNOSIS — S20211A Contusion of right front wall of thorax, initial encounter: Secondary | ICD-10-CM | POA: Diagnosis not present

## 2022-09-29 DIAGNOSIS — I10 Essential (primary) hypertension: Secondary | ICD-10-CM | POA: Diagnosis not present

## 2022-09-29 DIAGNOSIS — S3991XA Unspecified injury of abdomen, initial encounter: Secondary | ICD-10-CM | POA: Diagnosis not present

## 2022-09-30 DIAGNOSIS — N201 Calculus of ureter: Secondary | ICD-10-CM | POA: Diagnosis not present

## 2022-09-30 DIAGNOSIS — Z960 Presence of urogenital implants: Secondary | ICD-10-CM | POA: Diagnosis not present

## 2022-09-30 DIAGNOSIS — N390 Urinary tract infection, site not specified: Secondary | ICD-10-CM | POA: Diagnosis not present

## 2022-10-04 ENCOUNTER — Telehealth: Payer: Self-pay | Admitting: *Deleted

## 2022-10-04 NOTE — Transitions of Care (Post Inpatient/ED Visit) (Signed)
   10/04/2022  Name: Kathy Walton MRN: [id number] DOB: 25-Jan-1976  Today's TOC FU Call Status: Today's TOC FU Call Status:: Unsuccessul Call (1st Attempt) Unsuccessful Call (1st Attempt) Date: 10/04/22 All numbers listed message stated invalid numbers Attempted to reach the patient regarding the most recent Inpatient/ED visit.  Follow Up Plan: No further outreach attempts will be made at this time. We have been unable to contact the patient.  Gean Maidens BSN RN [facility] [phone]

## 2022-11-04 ENCOUNTER — Inpatient Hospital Stay
Admission: EM | Admit: 2022-11-04 | Discharge: 2022-11-08 | DRG: 698 | Discharge status: Against Medical Advice | Payer: Medicaid Other | Attending: Internal Medicine | Admitting: Internal Medicine

## 2022-11-04 ENCOUNTER — Emergency Department: Payer: Medicaid Other

## 2022-11-04 ENCOUNTER — Other Ambulatory Visit: Payer: Self-pay

## 2022-11-04 DIAGNOSIS — R109 Unspecified abdominal pain: Secondary | ICD-10-CM | POA: Insufficient documentation

## 2022-11-04 DIAGNOSIS — F1490 Cocaine use, unspecified, uncomplicated: Secondary | ICD-10-CM | POA: Diagnosis present

## 2022-11-04 DIAGNOSIS — F419 Anxiety disorder, unspecified: Secondary | ICD-10-CM | POA: Diagnosis present

## 2022-11-04 DIAGNOSIS — Z803 Family history of malignant neoplasm of breast: Secondary | ICD-10-CM | POA: Diagnosis not present

## 2022-11-04 DIAGNOSIS — T83592A Infection and inflammatory reaction due to indwelling ureteral stent, initial encounter: Secondary | ICD-10-CM | POA: Diagnosis not present

## 2022-11-04 DIAGNOSIS — N12 Tubulo-interstitial nephritis, not specified as acute or chronic: Principal | ICD-10-CM

## 2022-11-04 DIAGNOSIS — T83122A Displacement of urinary stent, initial encounter: Secondary | ICD-10-CM | POA: Diagnosis not present

## 2022-11-04 DIAGNOSIS — N136 Pyonephrosis: Secondary | ICD-10-CM | POA: Diagnosis present

## 2022-11-04 DIAGNOSIS — N200 Calculus of kidney: Secondary | ICD-10-CM | POA: Diagnosis not present

## 2022-11-04 DIAGNOSIS — Z818 Family history of other mental and behavioral disorders: Secondary | ICD-10-CM

## 2022-11-04 DIAGNOSIS — Z813 Family history of other psychoactive substance abuse and dependence: Secondary | ICD-10-CM | POA: Diagnosis not present

## 2022-11-04 DIAGNOSIS — Z9049 Acquired absence of other specified parts of digestive tract: Secondary | ICD-10-CM

## 2022-11-04 DIAGNOSIS — Z881 Allergy status to other antibiotic agents status: Secondary | ICD-10-CM

## 2022-11-04 DIAGNOSIS — N390 Urinary tract infection, site not specified: Secondary | ICD-10-CM | POA: Diagnosis not present

## 2022-11-04 DIAGNOSIS — M069 Rheumatoid arthritis, unspecified: Secondary | ICD-10-CM | POA: Diagnosis not present

## 2022-11-04 DIAGNOSIS — Z8249 Family history of ischemic heart disease and other diseases of the circulatory system: Secondary | ICD-10-CM | POA: Diagnosis not present

## 2022-11-04 DIAGNOSIS — E669 Obesity, unspecified: Secondary | ICD-10-CM | POA: Diagnosis not present

## 2022-11-04 DIAGNOSIS — D72829 Elevated white blood cell count, unspecified: Secondary | ICD-10-CM

## 2022-11-04 DIAGNOSIS — E876 Hypokalemia: Secondary | ICD-10-CM | POA: Insufficient documentation

## 2022-11-04 DIAGNOSIS — E039 Hypothyroidism, unspecified: Secondary | ICD-10-CM | POA: Diagnosis not present

## 2022-11-04 DIAGNOSIS — A419 Sepsis, unspecified organism: Secondary | ICD-10-CM

## 2022-11-04 DIAGNOSIS — N210 Calculus in bladder: Secondary | ICD-10-CM | POA: Diagnosis present

## 2022-11-04 DIAGNOSIS — F1111 Opioid abuse, in remission: Secondary | ICD-10-CM | POA: Diagnosis present

## 2022-11-04 DIAGNOSIS — N1330 Unspecified hydronephrosis: Secondary | ICD-10-CM

## 2022-11-04 DIAGNOSIS — Y732 Prosthetic and other implants, materials and accessory gastroenterology and urology devices associated with adverse incidents: Secondary | ICD-10-CM | POA: Diagnosis present

## 2022-11-04 DIAGNOSIS — M109 Gout, unspecified: Secondary | ICD-10-CM | POA: Diagnosis present

## 2022-11-04 DIAGNOSIS — Z87442 Personal history of urinary calculi: Secondary | ICD-10-CM

## 2022-11-04 DIAGNOSIS — Z5329 Procedure and treatment not carried out because of patient's decision for other reasons: Secondary | ICD-10-CM | POA: Diagnosis not present

## 2022-11-04 DIAGNOSIS — Z6830 Body mass index (BMI) 30.0-30.9, adult: Secondary | ICD-10-CM | POA: Diagnosis not present

## 2022-11-04 DIAGNOSIS — I10 Essential (primary) hypertension: Secondary | ICD-10-CM | POA: Diagnosis not present

## 2022-11-04 DIAGNOSIS — Z87891 Personal history of nicotine dependence: Secondary | ICD-10-CM

## 2022-11-04 DIAGNOSIS — N132 Hydronephrosis with renal and ureteral calculous obstruction: Secondary | ICD-10-CM | POA: Diagnosis not present

## 2022-11-04 DIAGNOSIS — F1491 Cocaine use, unspecified, in remission: Secondary | ICD-10-CM | POA: Diagnosis present

## 2022-11-04 DIAGNOSIS — B9689 Other specified bacterial agents as the cause of diseases classified elsewhere: Secondary | ICD-10-CM | POA: Diagnosis present

## 2022-11-04 DIAGNOSIS — R569 Unspecified convulsions: Secondary | ICD-10-CM

## 2022-11-04 DIAGNOSIS — R1084 Generalized abdominal pain: Secondary | ICD-10-CM | POA: Diagnosis not present

## 2022-11-04 LAB — URINALYSIS, COMPLETE (UACMP) WITH MICROSCOPIC
Bilirubin Urine: NEGATIVE
Glucose, UA: NEGATIVE mg/dL
Ketones, ur: NEGATIVE mg/dL
Nitrite: NEGATIVE
Protein, ur: 100 mg/dL — AB
RBC / HPF: >50 RBC/hpf (ref 0–5)
Specific Gravity, Urine: 1.015 (ref 1.005–1.030)
WBC, UA: >50 WBC/hpf (ref 0–5)
pH: 7 (ref 5.0–8.0)

## 2022-11-04 LAB — CBC
HCT: 34.3 % — ABNORMAL LOW (ref 36.0–46.0)
Hemoglobin: 11.2 g/dL — ABNORMAL LOW (ref 12.0–15.0)
MCH: 27.4 pg (ref 26.0–34.0)
MCHC: 32.7 g/dL (ref 30.0–36.0)
MCV: 83.9 fL (ref 80.0–100.0)
Platelets: 428 10*3/uL — ABNORMAL HIGH (ref 150–400)
RBC: 4.09 MIL/uL (ref 3.87–5.11)
RDW: 14.5 % (ref 11.5–15.5)
WBC: 12.9 10*3/uL — ABNORMAL HIGH (ref 4.0–10.5)
nRBC: 0 % (ref 0.0–0.2)

## 2022-11-04 LAB — COMPREHENSIVE METABOLIC PANEL
ALT: 10 U/L (ref 0–44)
AST: 17 U/L (ref 15–41)
Albumin: 2.8 g/dL — ABNORMAL LOW (ref 3.5–5.0)
Alkaline Phosphatase: 84 U/L (ref 38–126)
Anion gap: 10 (ref 5–15)
BUN: 10 mg/dL (ref 6–20)
CO2: 25 mmol/L (ref 22–32)
Calcium: 8.3 mg/dL — ABNORMAL LOW (ref 8.9–10.3)
Chloride: 100 mmol/L (ref 98–111)
Creatinine, Ser: 0.91 mg/dL (ref 0.44–1.00)
GFR, Estimated: >60 mL/min (ref >60)
Glucose, Bld: 125 mg/dL — ABNORMAL HIGH (ref 70–99)
Potassium: 3.2 mmol/L — ABNORMAL LOW (ref 3.5–5.1)
Sodium: 135 mmol/L (ref 135–145)
Total Bilirubin: 0.9 mg/dL (ref 0.3–1.2)
Total Protein: 8.4 g/dL — ABNORMAL HIGH (ref 6.5–8.1)

## 2022-11-04 LAB — MAGNESIUM: Magnesium: 2 mg/dL (ref 1.7–2.4)

## 2022-11-04 LAB — LACTIC ACID, PLASMA
Lactic Acid, Venous: 0.9 mmol/L (ref 0.5–1.9)
Lactic Acid, Venous: 1.1 mmol/L (ref 0.5–1.9)

## 2022-11-04 MED ORDER — ACETAMINOPHEN 325 MG RE SUPP: 650.0000 mg | Freq: Four times a day (QID) | RECTAL | Status: DC | PRN

## 2022-11-04 MED ORDER — LEVOFLOXACIN IN D5W 750 MG/150ML IV SOLN
750.0000 mg | INTRAVENOUS | Status: DC
  Administered 2022-11-05 – 2022-11-07 (×3): 750 mg via INTRAVENOUS
  Filled 2022-11-04 (×4): qty 150

## 2022-11-04 MED ORDER — ONDANSETRON HCL 4 MG/2ML IJ SOLN: 4.0000 mg | Freq: Four times a day (QID) | INTRAMUSCULAR | Status: DC | PRN

## 2022-11-04 MED ORDER — SODIUM CHLORIDE 0.9 % IV BOLUS
1000.0000 mL | Freq: Once | INTRAVENOUS | Status: AC
  Administered 2022-11-04: 1000 mL via INTRAVENOUS

## 2022-11-04 MED ORDER — LEVOFLOXACIN IN D5W 750 MG/150ML IV SOLN
750.0000 mg | Freq: Once | INTRAVENOUS | Status: AC
Start: 2022-11-04 — End: 2022-11-04
  Administered 2022-11-04: 750 mg via INTRAVENOUS
  Filled 2022-11-04: qty 150

## 2022-11-04 MED ORDER — MORPHINE SULFATE (PF) 2 MG/ML IV SOLN: 2.0000 mg | INTRAVENOUS | Status: DC | PRN

## 2022-11-04 MED ORDER — FENTANYL CITRATE PF 50 MCG/ML IJ SOSY
12.5000 ug | PREFILLED_SYRINGE | INTRAMUSCULAR | Status: DC | PRN
  Administered 2022-11-05: 12.5 ug via INTRAVENOUS
  Filled 2022-11-04: qty 1

## 2022-11-04 MED ORDER — LORAZEPAM 2 MG/ML IJ SOLN: 2.0000 mg | INTRAMUSCULAR | Status: DC | PRN

## 2022-11-04 MED ORDER — ONDANSETRON HCL 4 MG PO TABS: 4.0000 mg | ORAL_TABLET | Freq: Four times a day (QID) | ORAL | Status: DC | PRN

## 2022-11-04 MED ORDER — LABETALOL HCL 5 MG/ML IV SOLN: 5.0000 mg | INTRAVENOUS | Status: DC | PRN

## 2022-11-04 MED ORDER — SODIUM CHLORIDE 0.9 % IV BOLUS
500.0000 mL | Freq: Once | INTRAVENOUS | Status: AC
  Administered 2022-11-04: 500 mL via INTRAVENOUS

## 2022-11-04 MED ORDER — ACETAMINOPHEN 650 MG RE SUPP
650.0000 mg | Freq: Four times a day (QID) | RECTAL | Status: DC | PRN
  Administered 2022-11-05: 650 mg via RECTAL
  Filled 2022-11-04: qty 1

## 2022-11-04 MED ORDER — ONDANSETRON HCL 4 MG/2ML IJ SOLN
4.0000 mg | Freq: Once | INTRAMUSCULAR | Status: AC
  Administered 2022-11-04: 4 mg via INTRAVENOUS
  Filled 2022-11-04: qty 2

## 2022-11-04 MED ORDER — ACETAMINOPHEN 500 MG PO TABS
1000.0000 mg | ORAL_TABLET | Freq: Four times a day (QID) | ORAL | Status: DC | PRN
  Administered 2022-11-04 – 2022-11-07 (×2): 1000 mg via ORAL
  Filled 2022-11-04 (×2): qty 2

## 2022-11-04 MED ORDER — LORAZEPAM 0.5 MG PO TABS: 0.5000 mg | ORAL_TABLET | Freq: Four times a day (QID) | ORAL | Status: DC | PRN

## 2022-11-04 MED ORDER — MORPHINE SULFATE (PF) 4 MG/ML IV SOLN
4.0000 mg | INTRAVENOUS | Status: AC | PRN
  Administered 2022-11-04 – 2022-11-05 (×4): 4 mg via INTRAVENOUS
  Filled 2022-11-04 (×4): qty 1

## 2022-11-04 MED ORDER — LACTATED RINGERS IV BOLUS (SEPSIS)
1000.0000 mL | Freq: Once | INTRAVENOUS | Status: AC
  Administered 2022-11-04: 1000 mL via INTRAVENOUS

## 2022-11-04 MED ORDER — MORPHINE SULFATE (PF) 4 MG/ML IV SOLN
4.0000 mg | Freq: Once | INTRAVENOUS | Status: AC
  Administered 2022-11-04: 4 mg via INTRAVENOUS
  Filled 2022-11-04: qty 1

## 2022-11-04 MED ORDER — SENNOSIDES-DOCUSATE SODIUM 8.6-50 MG PO TABS: 1.0000 | ORAL_TABLET | Freq: Every evening | ORAL | Status: DC | PRN

## 2022-11-04 MED ORDER — POTASSIUM CHLORIDE CRYS ER 20 MEQ PO TBCR
40.0000 meq | EXTENDED_RELEASE_TABLET | Freq: Once | ORAL | Status: AC
  Administered 2022-11-04: 40 meq via ORAL
  Filled 2022-11-04: qty 2

## 2022-11-04 MED ORDER — ACETAMINOPHEN 325 MG PO TABS
650.0000 mg | ORAL_TABLET | Freq: Four times a day (QID) | ORAL | Status: DC | PRN
  Administered 2022-11-04: 650 mg via ORAL
  Filled 2022-11-04: qty 2

## 2022-11-04 NOTE — ED Provider Notes (Signed)
[facility] Provider Note    Event Date/Time   First MD Initiated Contact with Patient 11/04/22 [id number]     (approximate)  History   Chief Complaint: Abdominal Pain  HPI  Kathy Walton is a 47 y.o. female with a past medical history of anemia, prior kidney stones, right-sided ureteral stent per patient who presents to the emergency department for right flank pain and lower abdominal pain as well as dysuria.  According to the patient several years ago she had a right-sided ureteral stent placed for a kidney stone but never followed up with urology for removal.  She states since that time she has had significant pain in her right flank and lower abdomen intermittently over the past 2 years.  States the pain has been worse recently so she came to the emergency department today for evaluation.  States chills at home but has not measured a temperature.  States nausea but denies any vomiting or diarrhea.  Patient states she was supposed to follow-up with [facility] this week to discuss stent removal but did not make it to her appointment due to transportation issues.  Physical Exam   Triage Vital Signs: ED Triage Vitals  Enc Vitals Group     BP 11/04/22 0714 (!) 156/71     Pulse Rate 11/04/22 0712 (!) 110     Resp 11/04/22 0712 20     Temp 11/04/22 0712 99.1 F (37.3 C)     Temp Source 11/04/22 0712 Oral     SpO2 11/04/22 0712 98 %     Weight 11/04/22 0711 200 lb (90.7 kg)     Height 11/04/22 0711 5\' 8"  (1.727 m)     Head Circumference --      Peak Flow --      Pain Score 11/04/22 0712 10     Pain Loc --      Pain Edu? --      Excl. in GC? --     Most recent vital signs: Vitals:   11/04/22 0712 11/04/22 0714  BP:  (!) 156/71  Pulse: (!) 110   Resp: 20   Temp: 99.1 F (37.3 C)   SpO2: 98%     General: Awake, mild distress due to pain. CV:  Good peripheral perfusion.  Regular rate and rhythm  Resp:  Normal effort.  Equal breath sounds bilaterally.  Abd:  No  distention.  Soft, moderate suprapubic and right-sided tenderness.  Right CVA tenderness.  ED Results / Procedures / Treatments   RADIOLOGY  Reviewed and interpreted the CT images.  Patient has a very large right kidney likely due to hydronephrosis with several kidney stones seen in the ureter as well as a large bladder stone. Radiology has read the CT scan as no significant change in severe right hydronephrosis with right renal swelling and stranding possible chronic pyelonephritis.  Malpositioned right ureteral stent 1.4 cm calculus in the proximal pigtail of the stent 5 mm calculus adjacent to the stent in the right mid ureter, 5.3 cm bladder calculus.   MEDICATIONS ORDERED IN ED: Medications  morphine (PF) 4 MG/ML injection 4 mg (has no administration in time range)  ondansetron (ZOFRAN) injection 4 mg (has no administration in time range)  sodium chloride 0.9 % bolus 1,000 mL (has no administration in time range)     IMPRESSION / MDM / ASSESSMENT AND PLAN / ED COURSE  I reviewed the triage vital signs and the nursing notes.  Patient's presentation is most consistent  with acute presentation with potential threat to life or bodily function.  Patient presents emergency department for right flank pain/lower abdominal pain as well as dysuria.  States she has a ureteral stent in place which has been there for 2 years currently discussing with [facility] for removal per patient but has not followed up recently.  Patient states the pain waxes and wanes and over the past week or so it has been severe.  States nausea but denies vomiting or diarrhea states chills but does not measured a temperature.  Is in moderate distress due to pain, currently crying lying in a fetal position.  Patient states her mother died in the hospital and she is very anxious anytime she goes to the hospital and often times leaves AMA due to this anxiety.  Patient states this time she will stay for admission if  needed.  Patient's workup shows slight leukocytosis 12,900, chemistry shows overall reassuring results including normal renal function.  Patient's urinalysis does show greater than 50 red and white cells with many bacteria white blood cell clumps consistent with urinary tract infection.  CT scan consistent with stent malpositioned with severe right hydronephrosis several stones seen in the ureter as well as bladder, but largely stable from last imaging 11/25/2021.  Patient started on IV Levaquin due to cephalosporin allergy.  Urine culture has been sent.  Dr. Signa Kell is currently in the OR, the nurse has relayed information to him.  He is agreeable to admission to the hospital service with urology consultation.  I will discuss with hospitalist for admission.  FINAL CLINICAL IMPRESSION(S) / ED DIAGNOSES   Right flank pain Lower abdominal pain Pyelonephritis Infected ureteral stent   Note:  This document was prepared using Dragon voice recognition software and may include unintentional dictation errors.   Minna Antis, MD 11/04/22 [phone]

## 2022-11-04 NOTE — ED Notes (Signed)
Pt recently had tylenol--asked provider about 1000mg  more and it was stated to go ahead and give but to not exceed 4000mg /day

## 2022-11-04 NOTE — Assessment & Plan Note (Signed)
Severe, with perinephric stranding Malpositioned right ureteral stent, proximal pigtail loop in the proximal to mid right ureter Urology has been consulted by EDP Urology order placed via epic

## 2022-11-04 NOTE — Progress Notes (Signed)
Pharmacy Antibiotic Note  Kathy Walton is a 47 y.o. female w/ PMH of anemia, prior kidney stones, right-sided ureteral stent admitted on 11/04/2022 with UTI.  Pharmacy has been consulted for levofloxacin dosing. She has a known cephalosporin allergy (rash) with documented antibiotic exposure limited primarily to fluoroquinolones.   Plan:  start levofloxacin 750 mg IV every 24 hours ---monitor renal function for needed dose adjustments  Height: 5\' 8"  (172.7 cm) Weight: 90.7 kg (200 lb) IBW/kg (Calculated) : 63.9  Temp (24hrs), Avg:98.9 F (37.2 C), Min:98.6 F (37 C), Max:99.1 F (37.3 C)  Recent Labs  Lab 11/04/22 0725  WBC 12.9*  CREATININE 0.91    Estimated Creatinine Clearance: 91 mL/min (by C-G formula based on SCr of 0.91 mg/dL).    Allergies  Allergen Reactions   Cephalexin     Antimicrobials this admission: 05/17 levofloxacin >>   Microbiology results: 05/17 UCx: pending   Thank you for allowing pharmacy to be a part of this patient's care.  Lowella Bandy 11/04/2022 2:12 PM

## 2022-11-04 NOTE — Assessment & Plan Note (Signed)
Ativan 2 mg IV as needed for seizure, 2 doses ordered with instructions to administer as appropriate and then let provider know

## 2022-11-04 NOTE — Consult Note (Signed)
Contacted by ER regarding this patient with pyelonephritis admitted to medicine service.  She has had a right ureteral stent for at least 3+ years that was placed at an outside hospital and never removed.  She was recently hospitalized at [facility], and has follow-up with [facility] urology for attempted stent and stone management.  Prior CT from June 2023 shows right-sided hydronephrosis with the right ureteral stent with large 4 cm bladder stone as well as a proximal stone on the proximal curl as well.  Most recent CT from today 11/04/2022 shows similar findings, stent has migrated slightly distally to the mid ureter, similar hydronephrosis.  Very large bladder stone on the distal curl of the stent.  No role for any urologic intervention acutely in the setting of suspected infection, as any manipulation likely would exacerbate infection and sepsis.  Anticipate she can be managed with antibiotics alone, and follow-up with urologist at [facility] for complex endoscopic attempt at removal once infection has cleared.  If she were to develop high fevers, sepsis not responsive to antibiotics, or uncontrolled flank pain, could consider consultation with interventional radiology to consider right-sided nephrostomy tube placement for drainage.  -Agree with antibiotics -No role for any urologic intervention acutely in setting of infection -Consider right-sided nephrostomy tube placement if high fever, infection not responsive to antibiotics, or uncontrolled right flank pain.  Do not anticipate this will be needed in the setting of her chronic stent and similar CT findings from a year ago. -Follow-up with [facility] urology as scheduled for discussion of management of complex situation with retained ureteral stent with large 5 cm bladder stone and stone on proximal curl of the stent   Legrand Rams, MD 11/04/2022

## 2022-11-04 NOTE — Assessment & Plan Note (Signed)
Continue with Levaquin per pharmacy Check lactic acid Blood cultures x 2 ordered
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
## 2022-11-04 NOTE — Assessment & Plan Note (Signed)
Labetalol 5 mg IV every 3 hours as needed for SBP greater than 175, with instructions to hold if heart rate is less than 65 and let provider know, 4 doses ordered

## 2022-11-04 NOTE — Hospital Course (Addendum)
Kathy Walton, 47 year old female with history of anemia, kidney stones, history of right sided ureteral stent placement, who presents emergency department chief concerns of persistent right-sided flank pain over the last 2 years.  Vitals in the ED showed temperature of 99.1, respiration rate of 25, heart rate of 112, blood pressure 152/73, SpO2 of 98% on room air.  Serum sodium 135, potassium 2.2, chloride 100, bicarb 25, BUN of 10, serum creatinine 0.91, EGFR greater than 60, nonfasting blood glucose 125, WBC 12.9, hemoglobin 11.2, platelets of 428.  UA was positive for large leukocytes.  CT renal stone: No significant change in severe right hydronephrosis, right renal swelling, perinephritic stranding.  Chronic pyelonephritis cannot be excluded.  Malpositioning of right urethral stent, with proximal pigtail loop in the proximal to mid right ureter.  1.4 cm calculus within the proximal pigtail of the right urethral stent and a 5 mm calculus adjacent to the stent in the right mid ureter.  5.3 cm bladder calculus which encompasses the distal pigtail loop of the urethral stent. Right adnexa 4.1 cm simple appearing cystic lesion.  Stable mild retroperitoneal lymphadenopathy, likely reactive in etiology.  3 mm nonobstructing left renal calculus.  ED treatment: Morphine 4 mg IV one-time dose, ondansetron 4 mg IV one-time dose, Levaquin 750 mg IV one-time dose, sodium chloride 1 L bolus.

## 2022-11-04 NOTE — ED Triage Notes (Signed)
Patient complaining of pain to lower abdomen, low back and with urination; history of UTI's and kidney stones

## 2022-11-04 NOTE — Assessment & Plan Note (Signed)
Potassium chloride 40 mEq p.o. one-time dose, check serum magnesium level on admission

## 2022-11-04 NOTE — Assessment & Plan Note (Addendum)
Patient had increase heart rate, respiration rate, leukocytosis of 12.9, source of urine (large leukocytes) Check blood cultures x 2, lactic acid x 2 Levofloxacin per pharmacy Patient is status post sodium chloride 1 L bolus per EDP I ordered LR 1 L bolus on admission Admit to telemetry medical, inpatient

## 2022-11-04 NOTE — Assessment & Plan Note (Signed)
Ativan 0.5 mg p.o. every 6 hours as needed for anxiety, 1 day ordered

## 2022-11-04 NOTE — Assessment & Plan Note (Signed)
Presumed secondary to UTI in setting of possible sepsis Treat per above Repeat CBC in the a.m.

## 2022-11-04 NOTE — Assessment & Plan Note (Signed)
Presumed secondary to nephrolithiasis and malpositioned right urethral stent Symptomatic support: Morphine 2 mg IV every 4 hours as needed for moderate pain, 20 hours ordered; morphine 4 mg IV every 4 hours as needed for severe pain, 20 hours ordered; fentanyl 12.5 mcg IV every 3 hours as needed for severe pain for refractory to IV morphine, 18 hours ordered

## 2022-11-05 DIAGNOSIS — A419 Sepsis, unspecified organism: Secondary | ICD-10-CM | POA: Diagnosis not present

## 2022-11-05 DIAGNOSIS — N390 Urinary tract infection, site not specified: Secondary | ICD-10-CM | POA: Diagnosis not present

## 2022-11-05 LAB — PROTIME-INR
INR: 1.4 — ABNORMAL HIGH (ref 0.8–1.2)
Prothrombin Time: 17.2 seconds — ABNORMAL HIGH (ref 11.4–15.2)

## 2022-11-05 LAB — LACTIC ACID, PLASMA
Lactic Acid, Venous: 0.9 mmol/L (ref 0.5–1.9)
Lactic Acid, Venous: 0.8 mmol/L (ref 0.5–1.9)

## 2022-11-05 LAB — BASIC METABOLIC PANEL
Anion gap: 9 (ref 5–15)
BUN: 10 mg/dL (ref 6–20)
CO2: 24 mmol/L (ref 22–32)
Calcium: 8 mg/dL — ABNORMAL LOW (ref 8.9–10.3)
Chloride: 103 mmol/L (ref 98–111)
Creatinine, Ser: 0.87 mg/dL (ref 0.44–1.00)
GFR, Estimated: >60 mL/min (ref >60)
Glucose, Bld: 141 mg/dL — ABNORMAL HIGH (ref 70–99)
Potassium: 2.9 mmol/L — ABNORMAL LOW (ref 3.5–5.1)
Sodium: 136 mmol/L (ref 135–145)

## 2022-11-05 LAB — CBC
HCT: 29.9 % — ABNORMAL LOW (ref 36.0–46.0)
Hemoglobin: 9.9 g/dL — ABNORMAL LOW (ref 12.0–15.0)
MCH: 27.3 pg (ref 26.0–34.0)
MCHC: 33.1 g/dL (ref 30.0–36.0)
MCV: 82.6 fL (ref 80.0–100.0)
Platelets: 369 10*3/uL (ref 150–400)
RBC: 3.62 MIL/uL — ABNORMAL LOW (ref 3.87–5.11)
RDW: 14.5 % (ref 11.5–15.5)
WBC: 10.8 10*3/uL — ABNORMAL HIGH (ref 4.0–10.5)
nRBC: 0 % (ref 0.0–0.2)

## 2022-11-05 LAB — CORTISOL-AM, BLOOD: Cortisol - AM: 20.7 ug/dL (ref 6.7–22.6)

## 2022-11-05 LAB — MAGNESIUM: Magnesium: 1.8 mg/dL (ref 1.7–2.4)

## 2022-11-05 MED ORDER — MORPHINE SULFATE (PF) 2 MG/ML IV SOLN
2.0000 mg | Freq: Four times a day (QID) | INTRAVENOUS | Status: DC | PRN
  Administered 2022-11-05: 2 mg via INTRAVENOUS
  Filled 2022-11-05: qty 1

## 2022-11-05 MED ORDER — AMLODIPINE BESYLATE 10 MG PO TABS
10.0000 mg | ORAL_TABLET | Freq: Every day | ORAL | Status: DC
  Administered 2022-11-05 – 2022-11-08 (×4): 10 mg via ORAL
  Filled 2022-11-05 (×4): qty 1

## 2022-11-05 MED ORDER — POTASSIUM CHLORIDE CRYS ER 20 MEQ PO TBCR
40.0000 meq | EXTENDED_RELEASE_TABLET | Freq: Once | ORAL | Status: AC
  Administered 2022-11-05: 40 meq via ORAL
  Filled 2022-11-05: qty 2

## 2022-11-05 MED ORDER — POTASSIUM CHLORIDE 10 MEQ/100ML IV SOLN
10.0000 meq | INTRAVENOUS | Status: DC
  Administered 2022-11-05 (×4): 10 meq via INTRAVENOUS
  Filled 2022-11-05 (×4): qty 100

## 2022-11-05 MED ORDER — HYDROCODONE-ACETAMINOPHEN 5-325 MG PO TABS: 1.0000 | ORAL_TABLET | Freq: Four times a day (QID) | ORAL | Status: DC | PRN

## 2022-11-05 MED ORDER — HYDROCODONE-ACETAMINOPHEN 5-325 MG PO TABS
1.0000 | ORAL_TABLET | Freq: Four times a day (QID) | ORAL | Status: DC | PRN
  Administered 2022-11-05 – 2022-11-08 (×8): 1 via ORAL
  Filled 2022-11-05 (×8): qty 1

## 2022-11-05 MED ORDER — MORPHINE SULFATE (PF) 2 MG/ML IV SOLN
1.0000 mg | Freq: Four times a day (QID) | INTRAVENOUS | Status: AC | PRN
  Administered 2022-11-06 – 2022-11-07 (×4): 1 mg via INTRAVENOUS
  Filled 2022-11-05 (×5): qty 1

## 2022-11-05 MED ORDER — IBUPROFEN 400 MG PO TABS
400.0000 mg | ORAL_TABLET | Freq: Four times a day (QID) | ORAL | Status: DC | PRN
  Administered 2022-11-06 – 2022-11-08 (×7): 400 mg via ORAL
  Filled 2022-11-05 (×7): qty 1

## 2022-11-05 MED ORDER — SODIUM CHLORIDE 0.9 % IV BOLUS
1000.0000 mL | Freq: Once | INTRAVENOUS | Status: AC
  Administered 2022-11-05: 1000 mL via INTRAVENOUS

## 2022-11-05 MED ORDER — LABETALOL HCL 5 MG/ML IV SOLN: 10.0000 mg | INTRAVENOUS | Status: DC | PRN

## 2022-11-05 NOTE — Progress Notes (Signed)
Patient a [place] currently due to elevated temp, heart rate and tachypnea. Temp is 101.1 orally, Heart rate is 116 and respirations are 28. Patient is urinating. Vitals now seem to reflect the vitals that she had in the ED. Once we get temp down, vitals seem to stabilize.Giving rectal tylenol and notified provider via secure chat of red mews.

## 2022-11-05 NOTE — Progress Notes (Signed)
[facility]  - [place] at [facility]   PATIENT NAME: Kathy Walton    MR#:  [id number]  DATE OF BIRTH:  04/18/1976  SUBJECTIVE:   No family at bedside. Seen earlier. Patient very sleepy woken up with verbal commands however want to be left alone and wanted to rest. No new issues per RN   VITALS:  Blood pressure (!) 176/84, pulse (!) 107, temperature (!) 97.5 F (36.4 C), resp. rate (!) 24, height 5\' 8"  (1.727 m), weight 90.7 kg, last menstrual period 10/05/2022, SpO2 92 %, unknown if currently breastfeeding.  PHYSICAL EXAMINATION:   GENERAL:  47 y.o.-year-old patient with no acute distress. obese LUNGS: Normal breath sounds bilaterally, no wheezing CARDIOVASCULAR: S1, S2 normal. No murmur   ABDOMEN: Soft, nontender, nondistended. Bowel sounds present.  EXTREMITIES: No  edema b/l.    NEUROLOGIC: nonfocal  patient is sleepy SKIN: No obvious rash, lesion, or ulcer LABORATORY PANEL:  CBC Recent Labs  Lab 11/05/22 0530  WBC 10.8*  HGB 9.9*  HCT 29.9*  PLT 369    Chemistries  Recent Labs  Lab 11/04/22 0725 11/05/22 0526 11/05/22 0530  NA 135  --  136  K 3.2*  --  2.9*  CL 100  --  103  CO2 25  --  24  GLUCOSE 125*  --  141*  BUN 10  --  10  CREATININE 0.91  --  0.87  CALCIUM 8.3*  --  8.0*  MG 2.0 1.8  --   AST 17  --   --   ALT 10  --   --   ALKPHOS 84  --   --   BILITOT 0.9  --   --    Cardiac Enzymes No results for input(s): "TROPONINI" in the last 168 hours. RADIOLOGY:  CT Renal Stone Study  Result Date: 11/04/2022 CLINICAL DATA:  Right flank pain. Ureteral calculi and stent placement. EXAM: CT ABDOMEN AND PELVIS WITHOUT CONTRAST TECHNIQUE: Multidetector CT imaging of the abdomen and pelvis was performed following the standard protocol without IV contrast. RADIATION DOSE REDUCTION: This exam was performed according to the departmental dose-optimization program which includes automated exposure control, adjustment of the mA and/or kV according  to patient size and/or use of iterative reconstruction technique. COMPARISON:  11/25/2021 FINDINGS: Lower chest: New bandlike opacities in the right mid and lower lung, which may be due to scarring or atelectasis. Hepatobiliary: No mass visualized on this unenhanced exam. Prior cholecystectomy. No evidence of biliary obstruction. Pancreas: No mass or inflammatory process visualized on this unenhanced exam. Spleen:  Within normal limits in size. Adrenals/Urinary tract: 3 mm nonobstructing calculus is seen in the lower pole of the left kidney, without evidence of left-sided hydronephrosis. Diffuse right renal swelling and mild perinephric stranding shows no significant change. Severe right hydronephrosis is also unchanged. A right ureteral stent is seen in place, with the proximal pigtail loop in the upper to mid ureter, and showing involvement by a calculus within the proximal pigtail of the stent measuring 1.4 x 0.7 cm. A 5 mm calculus is seen adjacent to the stent in the right mid ureter on image 71/2. The distal pigtail of the stent is seen in the urinary bladder and is encompassed by a large bladder calculus measuring 5.3 x 4.0 cm. Stomach/Bowel: No evidence of obstruction, inflammatory process, or abnormal fluid collections. Vascular/Lymphatic: Mild retroperitoneal lymphadenopathy is seen in the retrocaval and aortocaval spaces, which is unchanged, with index lymph node measuring 1.6 cm on  image 42/2. No evidence of abdominal aortic aneurysm. Reproductive: Uterus is unremarkable. A small cystic lesion is seen in the right adnexa which measures 4.1 x 2.5 cm, mildly increased in size from 3.1 x 2.5 cm on previous study. No evidence of free fluid. Other:  None. Musculoskeletal:  No suspicious bone lesions identified. IMPRESSION: No significant change in severe right hydronephrosis, right renal swelling, and perinephric stranding. Chronic pyelonephritis cannot be excluded. Malpositioning of right ureteral stent,  with proximal pigtail loop in the proximal to mid right ureter. 1.4 cm calculus within the proximal pigtail of the right ureteral stent, and 5 mm calculus adjacent to the stent in the right mid ureter. 5.3 cm bladder calculus, which encompasses the distal pigtail loop of the ureteral stent. Stable mild retroperitoneal lymphadenopathy, likely reactive in etiology. 3 mm nonobstructing left renal calculus. 4.1 cm simple appearing cystic lesion in the right adnexa. No follow-up imaging recommended. Note: This recommendation does not apply to premenarchal patients and to those with increased risk (genetic, family history, elevated tumor markers or other high-risk factors) of ovarian cancer. Reference: JACR 2020 Feb; 17(2):248-254 New right mid and lower lung scarring or atelectasis. Electronically Signed   By: Danae Orleans M.D.   On: 11/04/2022 09:41    Assessment and Plan  Kathy Walton, 47 year old female with history of anemia, kidney stones, history of right sided ureteral stent placement, who presents emergency department chief concerns of persistent right-sided flank pain over the last 2 years.   CT renal stone: No significant change in severe right hydronephrosis, right renal swelling, perinephritic stranding.  Chronic pyelonephritis cannot be excluded.  Malpositioning of right urethral stent, with proximal pigtail loop in the proximal to mid right ureter.  1.4 cm calculus within the proximal pigtail of the right urethral stent and a 5 mm calculus adjacent to the stent in the right mid ureter.  5.3 cm bladder calculus which encompasses the distal pigtail loop of the urethral stent. Right adnexa 4.1 cm simple appearing cystic lesion.  Stable mild retroperitoneal lymphadenopathy, likely reactive in etiology.  3 mm nonobstructing left renal calculus.   Sepsis due to UTI hydronephrosis chronic of the right kidney with chronic writer ureteral and bladder stone status post right ureteral stent few years ago at  outlying hospital -- patient came in with fever, tachycardia, elevated white count, abnormal CT and abnormal UA -- received IV fluids -- lactate normal -- resume diet -- patient seen by urology Dr.Sninsky-- no urology intervention, continue IV antibiotics, if continues to spike fever will benefit from right kidney nephrectomy drain. -- If remains stable patient had finished antibiotic and follow-up with [facility] -- PRN pain meds -- continue IV Levaquin  H/o seizure like activity -- PRN Ativan  Hypertension -- PRN labetalol -- start amlodipine  Hypokalemia -- replace potassium    Procedures: Family communication :none Consults : urology CODE STATUS: full DVT Prophylaxis : ambulation Level of care: Telemetry Medical Status is: Inpatient Remains inpatient appropriate because: sepsis    TOTAL TIME TAKING CARE OF THIS PATIENT: 35 minutes.  >50% time spent on counselling and coordination of care  Note: This dictation was prepared with Dragon dictation along with smaller phrase technology. Any transcriptional errors that result from this process are unintentional.  Enedina Finner M.D    [facility]   CC: Primary care physician; Pcp, No

## 2022-11-05 NOTE — Consult Note (Signed)
PHARMACY CONSULT NOTE - ELECTROLYTES  Pharmacy Consult for Electrolyte Monitoring and Replacement   Recent Labs: Potassium (mmol/L)  Date Value  11/05/2022 2.9 (L)  02/27/2014 4.0   Magnesium (mg/dL)  Date Value  16/03/9603 1.8   Calcium (mg/dL)  Date Value  54/02/8118 8.0 (L)   Calcium, Total (mg/dL)  Date Value  14/78/2956 9.0   Albumin (g/dL)  Date Value  21/30/8657 2.8 (L)  02/27/2014 3.4   Sodium (mmol/L)  Date Value  11/05/2022 136  02/27/2014 134 (L)   Corrected Ca: 9.0 mg/dL  Assessment  Kathy Walton is a 47 y.o. female presenting with R-sided flank pain. PMH significant for kidney stones, R-sided ureteral stent placement. Pharmacy has been consulted to monitor and replace potassium.  Diet: regular MIVF: none  Goal of Therapy: Electrolytes WNL  Plan:  KCl 10 mEq IV x4 and KCl 40 mEq PO x1 ordered by NP Check BMP this afternoon   Thank you for allowing pharmacy to be a part of this patient's care.  Celene Squibb, PharmD PGY1 Pharmacy Resident 11/05/2022 9:18 AM

## 2022-11-05 NOTE — Plan of Care (Signed)
  Problem: Fluid Volume: Goal: Hemodynamic stability will improve Outcome: Not Progressing   Problem: Clinical Measurements: Goal: Diagnostic test results will improve Outcome: Not Progressing Goal: Signs and symptoms of infection will decrease Outcome: Not Progressing   Problem: Respiratory: Goal: Ability to maintain adequate ventilation will improve Outcome: Not Progressing   Problem: Education: Goal: Knowledge of General Education information will improve Description: Including pain rating scale, medication(s)/side effects and non-pharmacologic comfort measures Outcome: Not Progressing   Problem: Health Behavior/Discharge Planning: Goal: Ability to manage health-related needs will improve Outcome: Not Progressing   Problem: Clinical Measurements: Goal: Ability to maintain clinical measurements within normal limits will improve Outcome: Not Progressing Goal: Will remain free from infection Outcome: Not Progressing Goal: Diagnostic test results will improve Outcome: Not Progressing Goal: Respiratory complications will improve Outcome: Not Progressing Goal: Cardiovascular complication will be avoided Outcome: Not Progressing   Problem: Activity: Goal: Risk for activity intolerance will decrease Outcome: Not Progressing   Problem: Nutrition: Goal: Adequate nutrition will be maintained Outcome: Not Progressing   Problem: Coping: Goal: Level of anxiety will decrease Outcome: Not Progressing   Problem: Elimination: Goal: Will not experience complications related to bowel motility Outcome: Not Progressing Goal: Will not experience complications related to urinary retention Outcome: Not Progressing   Problem: Pain Managment: Goal: General experience of comfort will improve Outcome: Not Progressing   Problem: Safety: Goal: Ability to remain free from injury will improve Outcome: Not Progressing   Problem: Skin Integrity: Goal: Risk for impaired skin integrity  will decrease Outcome: Not Progressing   

## 2022-11-05 NOTE — Progress Notes (Signed)
Patient is alert and answers appropriately but is in severe pain anytime her eyes are open. Attempted to complete admission but when she is awake, she screams in pain until pain med is given then falls asleep afterwards. Admission could not be completed. Patient's labs this AM revealed a potassium of 2.9. Notified on call provider.

## 2022-11-06 DIAGNOSIS — N390 Urinary tract infection, site not specified: Secondary | ICD-10-CM | POA: Diagnosis not present

## 2022-11-06 DIAGNOSIS — A419 Sepsis, unspecified organism: Secondary | ICD-10-CM | POA: Diagnosis not present

## 2022-11-06 LAB — CBC
HCT: 29 % — ABNORMAL LOW (ref 36.0–46.0)
Hemoglobin: 9.5 g/dL — ABNORMAL LOW (ref 12.0–15.0)
MCH: 27.4 pg (ref 26.0–34.0)
MCHC: 32.8 g/dL (ref 30.0–36.0)
MCV: 83.6 fL (ref 80.0–100.0)
Platelets: 338 10*3/uL (ref 150–400)
RBC: 3.47 MIL/uL — ABNORMAL LOW (ref 3.87–5.11)
RDW: 14.5 % (ref 11.5–15.5)
WBC: 10.3 10*3/uL (ref 4.0–10.5)
nRBC: 0 % (ref 0.0–0.2)

## 2022-11-06 LAB — CULTURE, BLOOD (ROUTINE X 2): Culture: NO GROWTH

## 2022-11-06 LAB — BASIC METABOLIC PANEL
Anion gap: 8 (ref 5–15)
BUN: 13 mg/dL (ref 6–20)
CO2: 25 mmol/L (ref 22–32)
Calcium: 8.1 mg/dL — ABNORMAL LOW (ref 8.9–10.3)
Chloride: 98 mmol/L (ref 98–111)
Creatinine, Ser: 0.83 mg/dL (ref 0.44–1.00)
GFR, Estimated: >60 mL/min (ref >60)
Glucose, Bld: 181 mg/dL — ABNORMAL HIGH (ref 70–99)
Potassium: 3.1 mmol/L — ABNORMAL LOW (ref 3.5–5.1)
Sodium: 131 mmol/L — ABNORMAL LOW (ref 135–145)

## 2022-11-06 LAB — URINE CULTURE

## 2022-11-06 MED ORDER — POTASSIUM CHLORIDE CRYS ER 20 MEQ PO TBCR
40.0000 meq | EXTENDED_RELEASE_TABLET | ORAL | Status: AC
  Administered 2022-11-06 (×2): 40 meq via ORAL
  Filled 2022-11-06: qty 2

## 2022-11-06 MED ORDER — DIPHENHYDRAMINE HCL 25 MG PO CAPS
25.0000 mg | ORAL_CAPSULE | Freq: Three times a day (TID) | ORAL | Status: DC | PRN
  Administered 2022-11-06: 25 mg via ORAL
  Filled 2022-11-06: qty 1

## 2022-11-06 MED ORDER — ENOXAPARIN SODIUM 60 MG/0.6ML IJ SOSY
0.5000 mg/kg | PREFILLED_SYRINGE | INTRAMUSCULAR | Status: DC
  Administered 2022-11-06 – 2022-11-07 (×2): 45 mg via SUBCUTANEOUS
  Filled 2022-11-06 (×2): qty 0.6

## 2022-11-06 NOTE — Progress Notes (Signed)
  Subjective: C/o right sided flank pain, controlled. T max 100.7 earlier today and currently 98.   Objective: Vital signs in last 24 hours: Temp:  [98.2 F (36.8 C)-100.7 F (38.2 C)] 98.7 F (37.1 C) (05/19 1953) Pulse Rate:  [83-107] 92 (05/19 1953) Resp:  [16-20] 18 (05/19 1953) BP: (122-152)/(65-79) 122/72 (05/19 1953) SpO2:  [92 %-97 %] 95 % (05/19 1953)  Intake/Output from previous day: 05/18 0701 - 05/19 0700 In: 724.7 [P.O.:360; IV Piggyback:364.7] Out: 1400 [Urine:1400] Intake/Output this shift: No intake/output data recorded.  Physical Exam:  General: Alert and oriented CV: RRR Lungs: Clear Abdomen: Soft, ND, mild right CVA tenderness Ext: NT, No erythema  Lab Results: Recent Labs    11/04/22 0725 11/05/22 0530 11/06/22 1002  HGB 11.2* 9.9* 9.5*  HCT 34.3* 29.9* 29.0*   BMET Recent Labs    11/05/22 0530 11/06/22 1002  NA 136 131*  K 2.9* 3.1*  CL 103 98  CO2 24 25  GLUCOSE 141* 181*  BUN 10 13  CREATININE 0.87 0.83  CALCIUM 8.0* 8.1*     Studies/Results: No results found.  Assessment/Plan: Retained and displaced right ureteral stent with large bladder stone and right hydronephrosis with infection  -Given continued fevers, IR planning for right PCN on 5/20 -NPO after midnight -Continue antibiotics   LOS: 2 days   Matt R. Mosiah Bastin MD 11/06/2022, 9:06 PM [facility]

## 2022-11-06 NOTE — Progress Notes (Addendum)
[facility]  - [place] at [facility]   PATIENT NAME: Kathy Walton    MR#:  [id number]  DATE OF BIRTH:  05-05-1976  SUBJECTIVE:   No family at bedside. cont to have fever and drenched in sweat with fever breaking! Continue seven debate on right flank pain. Tolerating PO diet.  VITALS:  Blood pressure 122/65, pulse 87, temperature 98.2 F (36.8 C), temperature source Oral, resp. rate 16, height 5\' 8"  (1.727 m), weight 90.7 kg, last menstrual period 10/05/2022, SpO2 96 %, unknown if currently breastfeeding.  PHYSICAL EXAMINATION:   GENERAL:  47 y.o.-year-old patient with no acute distress. obese LUNGS: Normal breath sounds bilaterally, no wheezing CARDIOVASCULAR: S1, S2 normal. No murmur   ABDOMEN: Soft, nontender, nondistended. Bowel sounds present.  EXTREMITIES: No  edema b/l.    NEUROLOGIC: nonfocal  patient is sleepy however wakes up and does have a conversation. SKIN: No obvious rash, lesion, or ulcer LABORATORY PANEL:  CBC Recent Labs  Lab 11/06/22 1002  WBC 10.3  HGB 9.5*  HCT 29.0*  PLT 338     Chemistries  Recent Labs  Lab 11/04/22 0725 11/05/22 0526 11/05/22 0530 11/06/22 1002  NA 135  --    < > 131*  K 3.2*  --    < > 3.1*  CL 100  --    < > 98  CO2 25  --    < > 25  GLUCOSE 125*  --    < > 181*  BUN 10  --    < > 13  CREATININE 0.91  --    < > 0.83  CALCIUM 8.3*  --    < > 8.1*  MG 2.0 1.8  --   --   AST 17  --   --   --   ALT 10  --   --   --   ALKPHOS 84  --   --   --   BILITOT 0.9  --   --   --    < > = values in this interval not displayed.    Cardiac Enzymes No results for input(s): "TROPONINI" in the last 168 hours. RADIOLOGY:  No results found.  Assessment and Plan  Kathy Walton, 47 year old female with history of anemia, kidney stones, history of right sided ureteral stent placement, who presents emergency department chief concerns of persistent right-sided flank pain over the last 2 years.   CT renal stone: No  significant change in severe right hydronephrosis, right renal swelling, perinephritic stranding.  Chronic pyelonephritis cannot be excluded.  Malpositioning of right urethral stent, with proximal pigtail loop in the proximal to mid right ureter.  1.4 cm calculus within the proximal pigtail of the right urethral stent and a 5 mm calculus adjacent to the stent in the right mid ureter.  5.3 cm bladder calculus which encompasses the distal pigtail loop of the urethral stent. Right adnexa 4.1 cm simple appearing cystic lesion.  Stable mild retroperitoneal lymphadenopathy, likely reactive in etiology.  3 mm nonobstructing left renal calculus.   Sepsis due to UTI Hydronephrosis chronic of the right kidney with chronic ureteral stent (placed in 2021)and bladder stone status post right ureteral stent few years ago at outlying hospital -- patient came in with fever, tachycardia, elevated white count, abnormal CT and abnormal UA -- received IV fluids -- lactate normal -- resume diet -- patient seen by urology Dr.Sninsky-- no urology intervention, continue IV antibiotics, if continues to spike fever will benefit from  right kidney nephrostomy drain. -- If remains stable patient had finished antibiotic and follow-up with [facility] -- PRN pain meds -- continue IV Levaquin -- discussed with Dr. Carrie Mew urology on call--agrees with plan to place drain. --IR Dr Elby Showers informed --agrees and keep pt NPO for procedure tomorrow --- This was discussed with patient at length and she is in agreement with plan. Tried to call [facility] no voicemail set up. Spoke with patient's father on the phone and informed.  H/o seizure like activity -- PRN Ativan  Hypertension -- PRN labetalol -- start amlodipine  Hypokalemia -- replace potassium    Procedures: Family communication :father on the phone Consults : urology CODE STATUS: full DVT Prophylaxis : ambulation, lovenox Level of care: Med-Surg Status is:  Inpatient Remains inpatient appropriate because: sepsis, needs right nephrostomy drain    TOTAL TIME TAKING CARE OF THIS PATIENT: 35 minutes.  >50% time spent on counselling and coordination of care  Note: This dictation was prepared with Dragon dictation along with smaller phrase technology. Any transcriptional errors that result from this process are unintentional.  Enedina Finner M.D    [facility]   CC: Primary care physician; Pcp, No

## 2022-11-06 NOTE — Consult Note (Signed)
PHARMACY CONSULT NOTE - ELECTROLYTES  Pharmacy Consult for Electrolyte Monitoring and Replacement   Recent Labs: Potassium (mmol/L)  Date Value  11/06/2022 3.1 (L)  02/27/2014 4.0   Magnesium (mg/dL)  Date Value  16/03/9603 1.8   Calcium (mg/dL)  Date Value  54/02/8118 8.1 (L)   Calcium, Total (mg/dL)  Date Value  14/78/2956 9.0   Albumin (g/dL)  Date Value  21/30/8657 2.8 (L)  02/27/2014 3.4   Sodium (mmol/L)  Date Value  11/06/2022 131 (L)  02/27/2014 134 (L)   Corrected Ca: 9.0 mg/dL  Assessment  Kathy Walton is a 47 y.o. female presenting with R-sided flank pain. PMH significant for kidney stones, R-sided ureteral stent placement. Pharmacy has been consulted to monitor and replace potassium.  Diet: regular MIVF: none  Goal of Therapy: Electrolytes WNL  Plan:  KCl 40 mEq PO x 2 Will recheck electrolytes with AM labs tomorrow  Thank you for allowing pharmacy to be a part of this patient's care.  Bettey Costa, PharmD Clinical Pharmacist 11/06/2022 11:31 AM

## 2022-11-07 ENCOUNTER — Encounter: Payer: Self-pay | Admitting: Internal Medicine

## 2022-11-07 ENCOUNTER — Other Ambulatory Visit: Payer: Self-pay | Admitting: *Deleted

## 2022-11-07 ENCOUNTER — Inpatient Hospital Stay: Payer: Medicaid Other | Admitting: Radiology

## 2022-11-07 DIAGNOSIS — N390 Urinary tract infection, site not specified: Secondary | ICD-10-CM

## 2022-11-07 DIAGNOSIS — A419 Sepsis, unspecified organism: Secondary | ICD-10-CM | POA: Diagnosis not present

## 2022-11-07 HISTORY — PX: IR NEPHROSTOMY PLACEMENT RIGHT: IMG6064

## 2022-11-07 LAB — BASIC METABOLIC PANEL
Anion gap: 8 (ref 5–15)
BUN: 18 mg/dL (ref 6–20)
CO2: 25 mmol/L (ref 22–32)
Calcium: 8.3 mg/dL — ABNORMAL LOW (ref 8.9–10.3)
Chloride: 103 mmol/L (ref 98–111)
Creatinine, Ser: 0.87 mg/dL (ref 0.44–1.00)
GFR, Estimated: >60 mL/min (ref >60)
Glucose, Bld: 134 mg/dL — ABNORMAL HIGH (ref 70–99)
Potassium: 3.6 mmol/L (ref 3.5–5.1)
Sodium: 136 mmol/L (ref 135–145)

## 2022-11-07 LAB — MAGNESIUM: Magnesium: 2 mg/dL (ref 1.7–2.4)

## 2022-11-07 LAB — PHOSPHORUS: Phosphorus: 3.6 mg/dL (ref 2.5–4.6)

## 2022-11-07 LAB — HEMOGLOBIN AND HEMATOCRIT, BLOOD
HCT: 31 % — ABNORMAL LOW (ref 36.0–46.0)
HCT: 30.5 % — ABNORMAL LOW (ref 36.0–46.0)
Hemoglobin: 10.3 g/dL — ABNORMAL LOW (ref 12.0–15.0)
Hemoglobin: 10.1 g/dL — ABNORMAL LOW (ref 12.0–15.0)

## 2022-11-07 MED ORDER — HYDROMORPHONE HCL 1 MG/ML IJ SOLN
3.0000 mg | Freq: Once | INTRAMUSCULAR | Status: AC
  Administered 2022-11-07: 3 mg via INTRAVENOUS
  Filled 2022-11-07: qty 3

## 2022-11-07 MED ORDER — MIDAZOLAM HCL 2 MG/2ML IJ SOLN
INTRAMUSCULAR | Status: AC | PRN
  Administered 2022-11-07: .5 mg via INTRAVENOUS
  Administered 2022-11-07: 1 mg via INTRAVENOUS

## 2022-11-07 MED ORDER — MEPERIDINE HCL 25 MG/ML IJ SOLN
25.0000 mg | Freq: Once | INTRAMUSCULAR | Status: AC
  Administered 2022-11-07: 25 mg via INTRAVENOUS
  Filled 2022-11-07 (×2): qty 1

## 2022-11-07 MED ORDER — VANCOMYCIN HCL IN DEXTROSE 1-5 GM/200ML-% IV SOLN: 1000.0000 mg | INTRAVENOUS | Status: AC

## 2022-11-07 MED ORDER — SODIUM CHLORIDE 0.9% FLUSH
5.0000 mL | Freq: Three times a day (TID) | INTRAVENOUS | Status: DC
  Administered 2022-11-07 – 2022-11-08 (×3): 5 mL

## 2022-11-07 MED ORDER — LIDOCAINE HCL 1 % IJ SOLN
INTRAMUSCULAR | Status: AC
  Filled 2022-11-07: qty 20

## 2022-11-07 MED ORDER — MORPHINE SULFATE (PF) 2 MG/ML IV SOLN
2.0000 mg | Freq: Four times a day (QID) | INTRAVENOUS | Status: DC | PRN
  Administered 2022-11-07: 2 mg via INTRAVENOUS
  Filled 2022-11-07: qty 1

## 2022-11-07 MED ORDER — MIDAZOLAM HCL 5 MG/5ML IJ SOLN
INTRAMUSCULAR | Status: AC | PRN
  Administered 2022-11-07: .5 mg via INTRAVENOUS

## 2022-11-07 MED ORDER — MORPHINE SULFATE (PF) 2 MG/ML IV SOLN
2.0000 mg | INTRAVENOUS | Status: DC | PRN
  Administered 2022-11-07 – 2022-11-08 (×3): 2 mg via INTRAVENOUS
  Filled 2022-11-07 (×3): qty 1

## 2022-11-07 MED ORDER — VANCOMYCIN HCL IN DEXTROSE 1-5 GM/200ML-% IV SOLN
INTRAVENOUS | Status: AC | PRN
  Administered 2022-11-07: 1000 mg via INTRAVENOUS

## 2022-11-07 MED ORDER — FENTANYL CITRATE (PF) 100 MCG/2ML IJ SOLN
INTRAMUSCULAR | Status: AC | PRN
  Administered 2022-11-07: 50 ug via INTRAVENOUS
  Administered 2022-11-07 (×2): 25 ug via INTRAVENOUS

## 2022-11-07 MED ORDER — VANCOMYCIN HCL IN DEXTROSE 1-5 GM/200ML-% IV SOLN
INTRAVENOUS | Status: AC
  Filled 2022-11-07: qty 200

## 2022-11-07 MED ORDER — IOHEXOL 300 MG/ML  SOLN
10.0000 mL | Freq: Once | INTRAMUSCULAR | Status: AC | PRN
  Administered 2022-11-07: 10 mL

## 2022-11-07 MED ORDER — MIDAZOLAM HCL 2 MG/2ML IJ SOLN
INTRAMUSCULAR | Status: AC
  Filled 2022-11-07: qty 2

## 2022-11-07 MED ORDER — LIDOCAINE HCL 1 % IJ SOLN
10.0000 mL | Freq: Once | INTRAMUSCULAR | Status: AC
  Administered 2022-11-07: 10 mL via INTRADERMAL
  Filled 2022-11-07: qty 10

## 2022-11-07 MED ORDER — FENTANYL CITRATE (PF) 100 MCG/2ML IJ SOLN
INTRAMUSCULAR | Status: AC
  Filled 2022-11-07: qty 2

## 2022-11-07 MED ORDER — SODIUM CHLORIDE 0.9 % IV SOLN: INTRAVENOUS | Status: DC

## 2022-11-07 NOTE — Consult Note (Signed)
PHARMACY CONSULT NOTE - ELECTROLYTES  Pharmacy Consult for Electrolyte Monitoring and Replacement   Recent Labs: Potassium (mmol/L)  Date Value  11/07/2022 3.6  02/27/2014 4.0   Magnesium (mg/dL)  Date Value  78/29/5621 2.0   Calcium (mg/dL)  Date Value  30/86/5784 8.3 (L)   Calcium, Total (mg/dL)  Date Value  69/62/9528 9.0   Albumin (g/dL)  Date Value  41/32/4401 2.8 (L)  02/27/2014 3.4   Phosphorus (mg/dL)  Date Value  02/72/5366 3.6   Sodium (mmol/L)  Date Value  11/07/2022 136  02/27/2014 134 (L)   Corrected Ca: 9.0 mg/dL  Assessment  Kathy Walton is a 47 y.o. female presenting with R-sided flank pain. PMH significant for kidney stones, R-sided ureteral stent placement. Pharmacy has been consulted to monitor and replace potassium.  Diet: regular MIVF: none  Goal of Therapy: Electrolytes WNL  Plan:  K within normal limits this morning. No replacement needed today. Will recheck electrolytes with AM labs tomorrow  Thank you for allowing pharmacy to be a part of this patient's care.  Marlicia Sroka Rodriguez-Guzman PharmD, BCPS 11/07/2022 7:38 AM

## 2022-11-07 NOTE — Progress Notes (Signed)
Pharmacy Antibiotic Note  Kathy Walton is a 47 y.o. female w/ PMH of anemia, prior kidney stones, right-sided ureteral stent admitted on 11/04/2022 with UTI.  Pharmacy has been consulted for levofloxacin dosing. She has a known cephalosporin allergy (rash) with documented antibiotic exposure limited primarily to fluoroquinolones.   Renal function stable on day #4 of 6 of Levaquin therapy.  WBC 12.69 >>10.3, Tmax 99 Planned Percutaneous nephrostomy today 5/20.  Plan:   Continue levofloxacin 750 mg IV every 24 hours Monitor clinical progress and renal function to adjust therapy if/as needed.   Height: 5\' 8"  (172.7 cm) Weight: 90.7 kg (200 lb) IBW/kg (Calculated) : 63.9  Temp (24hrs), Avg:98.6 F (37 C), Min:98.2 F (36.8 C), Max:99 F (37.2 C)  Recent Labs  Lab 11/04/22 0725 11/04/22 1412 11/04/22 1933 11/05/22 0530 11/05/22 0835 11/06/22 1002 11/07/22 0624  WBC 12.9*  --   --  10.8*  --  10.3  --   CREATININE 0.91  --   --  0.87  --  0.83 0.87  LATICACIDVEN  --  0.9 1.1 0.9 0.8  --   --      Estimated Creatinine Clearance: 95.2 mL/min (by C-G formula based on SCr of 0.87 mg/dL).    Allergies  Allergen Reactions   Cephalexin Rash    Antimicrobials this admission: 05/17 levofloxacin >>   Microbiology results: 05/17 UCx: multiple organism suggesting recollection. 5/15: Blood cx: NG x 2 days  Thank you for allowing pharmacy to be a part of this patient's care.  Jaeline Whobrey Rodriguez-Guzman PharmD, BCPS 11/07/2022 7:31 AM

## 2022-11-07 NOTE — Progress Notes (Addendum)
Brief Interventional Radiology Note:   Received message via epic chat from RN that patient was experiencing severe pain, rigors and approximately 450 cc bright red output from newly placed PCN.  Dr. Archer Asa made aware and advised some bleeding is not unexpected due to the amount of inflammation and pus removed during PCN placement.  25 mg Demerol was ordered for rigors and admitting physician and RN made aware.  Postprocedural bleeding is not unexpected and should clear within a couple days but should be monitored as well as H&H.  IR to follow. Please contact IR with questions or concerns.   Alex Gardener, AGNP-BC 11/07/2022, 3:52 PM

## 2022-11-07 NOTE — Plan of Care (Signed)

## 2022-11-07 NOTE — Progress Notes (Signed)
Patient clinically stable post Right IR Nephrostomy tube placement per Dr. Archer Asa, tolerated well. 220 ml tan purulent fluid removed upon insertion. Received Versed 2 mg along with Fentanyl 100 mcg IV for procedure. Received Vancomycin IV for procedure. Report given to [facility] RN post procedure/16.
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
## 2022-11-07 NOTE — Progress Notes (Signed)
Chap visited patient by the bedside, Pt expressed fatigue and mentioned that it is better if I can come back at another time.   11/07/22 1500  Spiritual Encounters  Type of Visit Initial  Care provided to: Patient  Referral source Chaplain assessment  Reason for visit Routine spiritual support  OnCall Visit No  Spiritual Framework  Presenting Themes Impactful experiences and emotions  Interventions  Spiritual Care Interventions Made Compassionate presence;Mindfulness intervention  Intervention Outcomes  Outcomes Awareness of support;Connection to spiritual care  Spiritual Care Plan  Spiritual Care Issues Still Outstanding No further spiritual care needs at this time (see row info)

## 2022-11-07 NOTE — Progress Notes (Signed)
[facility]  - [place] [facility]   PATIENT NAME: Kathy Walton    MR#:  [id number]  DATE OF BIRTH:  1975-10-09  SUBJECTIVE:  No family at bedside. On and off right flank pain Tolerating PO diet.  VITALS:  Blood pressure (!) 150/85, pulse 96, temperature (!) 97.3 F (36.3 C), resp. rate (!) 28, height 5\' 8"  (1.727 m), weight 90.7 kg, last menstrual period 10/05/2022, SpO2 93 %, unknown if currently breastfeeding.  PHYSICAL EXAMINATION:   GENERAL:  47 y.o.-year-old patient with no acute distress. obese LUNGS: Normal breath sounds bilaterally, no wheezing CARDIOVASCULAR: S1, S2 normal. No murmur   ABDOMEN: Soft, nontender, nondistended. Bowel sounds present.  EXTREMITIES: No  edema b/l.    NEUROLOGIC: nonfocal  patient is sleepy however wakes up and does have a conversation. SKIN: No obvious rash, lesion, or ulcer LABORATORY PANEL:  CBC Recent Labs  Lab 11/06/22 1002  WBC 10.3  HGB 9.5*  HCT 29.0*  PLT 338     Chemistries  Recent Labs  Lab 11/04/22 0725 11/05/22 0526 11/07/22 0624  NA 135   < > 136  K 3.2*   < > 3.6  CL 100   < > 103  CO2 25   < > 25  GLUCOSE 125*   < > 134*  BUN 10   < > 18  CREATININE 0.91   < > 0.87  CALCIUM 8.3*   < > 8.3*  MG 2.0   < > 2.0  AST 17  --   --   ALT 10  --   --   ALKPHOS 84  --   --   BILITOT 0.9  --   --    < > = values in this interval not displayed.    Cardiac Enzymes No results for input(s): "TROPONINI" in the last 168 hours. RADIOLOGY:  IR NEPHROSTOMY PLACEMENT RIGHT  Result Date: 11/07/2022 INDICATION: 47 year old female with history of previously placed right ureteral stent. Unfortunately, patient has not undergone routine follow-up and the distal loop of the stent is completely encased in a large bladder stone. She now has severely obstructed pyonephrosis of the right kidney and presents for percutaneous nephrostomy tube placement. EXAM: IR NEPHROSTOMY PLACEMENT RIGHT COMPARISON:  CT scan of the  abdomen and pelvis 11/04/2022 MEDICATIONS: In patient currently receiving antibiotic therapy. No additional prophylaxis was administered. ANESTHESIA/SEDATION: Fentanyl 100 mcg IV; Versed 2 mg IV Moderate Sedation Time:  16 minutes The patient's vital signs and level of consciousness were continuously monitored during the procedure by the interventional radiology nurse under my direct supervision. CONTRAST:  10mL OMNIPAQUE IOHEXOL 300 MG/ML SOLN - administered into the collecting system(s) FLUOROSCOPY: Radiation exposure index: 22 mGy reference air kerma COMPLICATIONS: None immediate. TECHNIQUE: The procedure, risks, benefits, and alternatives were explained to the patient. Questions regarding the procedure were encouraged and answered. The patient understands and consents to the procedure. The right flank was prepped with chlorhexidine in a sterile fashion, and a sterile drape was applied covering the operative field. A sterile gown and sterile gloves were used for the procedure. Local anesthesia was provided with 1% Lidocaine. The right flank was interrogated with ultrasound and the left kidney identified. The kidney is severely hydronephrotic and the retained urine is highly complex in appearance. A suitable access site on the skin overlying the lower pole, posterior calix was identified. After local mg anesthesia was achieved, a small skin nick was made with an 11 blade scalpel. A 21 gauge  Accustick needle was then advanced under direct sonographic guidance into the lower pole of the right kidney. A 0.018 inch wire was advanced under fluoroscopic guidance into the left renal collecting system. There was return of grossly purulent urine. The Accustick sheath was then advanced over the wire and a 0.018 system exchanged for a 0.035 system. The tract from the scan into the renal collecting system was then dilated serially to 10-French. A 10-French Cook all-purpose drain was then placed and positioned under  fluoroscopic guidance. The locking loop is well formed within the renal pelvis. Several 100 mL of frankly purulent fluid was then aspirated from the renal collecting system. A sample was sent for Gram stain and culture. A gentle hand injection of contrast material was then performed. The contrast material outlines the highly irregular renal collecting system. The catheter was secured to the skin with 2-0 Prolene and a sterile bandage was placed. Catheter was left to gravity bag drainage. IMPRESSION: Successful placement of a right 10 French percutaneous nephrostomy tube. The urine is grossly purulent and a sample was sent for Gram stain and culture. Electronically Signed   By: Malachy Moan M.D.   On: 11/07/2022 12:58    Assessment and Plan  Kathy Walton, 47 year old female with history of anemia, kidney stones, history of right sided ureteral stent placement, who presents emergency department chief concerns of persistent right-sided flank pain over the last 2 years.   CT renal stone: No significant change in severe right hydronephrosis, right renal swelling, perinephritic stranding.  Chronic pyelonephritis cannot be excluded.  Malpositioning of right urethral stent, with proximal pigtail loop in the proximal to mid right ureter.  1.4 cm calculus within the proximal pigtail of the right urethral stent and a 5 mm calculus adjacent to the stent in the right mid ureter.  5.3 cm bladder calculus which encompasses the distal pigtail loop of the urethral stent. Right adnexa 4.1 cm simple appearing cystic lesion.  Stable mild retroperitoneal lymphadenopathy, likely reactive in etiology.  3 mm nonobstructing left renal calculus.   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 -- If remains stable patient had finished antibiotic and follow-up with [facility] -- PRN pain meds -- continue IV Levaquin -- discussed with Dr. Carrie Mew urology on call--agrees with plan to place drain. --IR Dr Elby Showers informed --agrees and keep pt NPO for procedure tomorrow --- This was discussed with patient at length and she is in agreement with plan. Tried to call [facility] no voicemail set up. Spoke with patient's father on the phone and informed. --pt get get right Nephrostomy drain placement today. [facility] aware   H/o seizure like activity -- PRN Ativan  Hypertension -- PRN labetalol -- start amlodipine  Hypokalemia -- replace potassium    Procedures: Family communication :none today Consults : urology CODE STATUS: full DVT Prophylaxis : ambulation, lovenox Level of care: Med-Surg Status is: Inpatient Remains inpatient appropriate because: sepsis, needs right nephrostomy drain    TOTAL TIME TAKING CARE OF THIS PATIENT: 35 minutes.  >50% time spent on counselling and coordination  of care  Note: This dictation was prepared with Dragon dictation along with smaller phrase technology. Any transcriptional errors that result from this process are unintentional.  Enedina Finner M.D    [facility]   CC: Primary care physician; [name]

## 2022-11-08 DIAGNOSIS — N390 Urinary tract infection, site not specified: Secondary | ICD-10-CM | POA: Diagnosis not present

## 2022-11-08 DIAGNOSIS — A419 Sepsis, unspecified organism: Secondary | ICD-10-CM | POA: Diagnosis not present

## 2022-11-08 LAB — BASIC METABOLIC PANEL
Anion gap: 9 (ref 5–15)
BUN: 18 mg/dL (ref 6–20)
CO2: 25 mmol/L (ref 22–32)
Calcium: 8.2 mg/dL — ABNORMAL LOW (ref 8.9–10.3)
Chloride: 99 mmol/L (ref 98–111)
Creatinine, Ser: 0.98 mg/dL (ref 0.44–1.00)
GFR, Estimated: >60 mL/min (ref >60)
Glucose, Bld: 140 mg/dL — ABNORMAL HIGH (ref 70–99)
Potassium: 3.5 mmol/L (ref 3.5–5.1)
Sodium: 133 mmol/L — ABNORMAL LOW (ref 135–145)

## 2022-11-08 LAB — HEMOGLOBIN AND HEMATOCRIT, BLOOD
HCT: 27.9 % — ABNORMAL LOW (ref 36.0–46.0)
Hemoglobin: 8.9 g/dL — ABNORMAL LOW (ref 12.0–15.0)

## 2022-11-08 LAB — AEROBIC/ANAEROBIC CULTURE W GRAM STAIN (SURGICAL/DEEP WOUND): Special Requests: NORMAL

## 2022-11-08 LAB — CULTURE, BLOOD (ROUTINE X 2): Special Requests: ADEQUATE

## 2022-11-08 MED ORDER — LEVOFLOXACIN 750 MG PO TABS: 750.0000 mg | ORAL_TABLET | Freq: Every day | ORAL | 0 refills | Status: AC

## 2022-11-08 MED ORDER — LEVOFLOXACIN 750 MG PO TABS: 750.0000 mg | ORAL_TABLET | Freq: Every day | ORAL | Status: DC

## 2022-11-08 MED ORDER — SODIUM CHLORIDE FLUSH 0.9 % IV SOLN: INTRAVENOUS | 0 refills | Status: DC

## 2022-11-08 NOTE — Progress Notes (Signed)
PHARMACIST - PHYSICIAN COMMUNICATION  CONCERNING: Antibiotic IV to Oral Route Change Policy  RECOMMENDATION: This patient is receiving levofloxacin by the intravenous route.  Based on criteria approved by the Pharmacy and Therapeutics Committee, the antibiotic(s) is/are being converted to the equivalent oral dose form(s).   DESCRIPTION: These criteria include: Patient being treated for a respiratory tract infection, urinary tract infection, cellulitis or clostridium difficile associated diarrhea if on metronidazole The patient is not neutropenic and does not exhibit a GI malabsorption state The patient is eating (either orally or via tube) and/or has been taking other orally administered medications for a least 24 hours The patient is improving clinically and has a Tmax < 100.5  If you have questions about this conversion, please contact the [facility] Department

## 2022-11-08 NOTE — Progress Notes (Signed)
Patient states she will be leaving against medical advice at 1200 today. She states she has to pick up her children. Informed patient of the risks associated with leaving AMA, but patient states she is leaving regardless. Educated patient on how to flush nephrostomy drain and peripheral IV removed.

## 2022-11-08 NOTE — Plan of Care (Signed)

## 2022-11-08 NOTE — Discharge Instructions (Signed)
IR schedulers will reach out to patient to schedule follow-up appointment for PCN exchange. Please call [phone] or [phone] with any questions or concerns regarding your drain.

## 2022-11-08 NOTE — Progress Notes (Signed)
Referring Physician(s): Legrand Rams, MD  Supervising Physician: Pernell Dupre  Patient Status:  [facility] - In-pt  Chief Complaint:  Pyelonephritis, right hydronephrosis s/p right PCN placement 11/07/22  Subjective:  Patient alert and awake at time of exam. She stated that she was leaving the hospital AMA at noon.  Allergies: Cephalexin  Medications: Prior to Admission medications   Medication Sig Start Date End Date Taking? Authorizing Provider  ibuprofen (ADVIL) 200 MG tablet Take 200 mg by mouth every 6 (six) hours as needed for cramping, moderate pain, mild pain, headache or fever.   Yes [provider]  levofloxacin (LEVAQUIN) 750 MG tablet Take 1 tablet (750 mg total) by mouth daily for 7 days. 11/08/22 11/15/22 Yes Enedina Finner, MD  sodium chloride flush 0.9 % SOLN injection Please flush drain once daily with 5 mL of sterile saline 11/08/22  Yes Kennieth Francois, PA  Doxylamine-Pyridoxine ER (BONJESTA) 20-20 MG TBCR Take 1 capsule by mouth 2 (two) times daily. Patient not taking: Reported on 11/04/2022 10/10/17   Doreene Burke, CNM  naloxone [facility]) nasal spray 4 mg/0.1 mL As needed/directed for overdose Patient not taking: Reported on 11/04/2022 12/16/19   Shaune Pollack, MD  ondansetron (ZOFRAN-ODT) 4 MG disintegrating tablet Take 1 tablet (4 mg total) by mouth every 8 (eight) hours as needed for nausea or vomiting. Patient not taking: Reported on 11/04/2022 11/25/21   Sharman Cheek, MD     Vital Signs: BP 133/69 (BP Location: Right Arm)   Pulse 93   Temp 98.7 F (37.1 C)   Resp 20   Ht 5\' 8"  (1.727 m)   Wt 199 lb 15.3 oz (90.7 kg)   LMP 10/05/2022   SpO2 92%   BMI 30.40 kg/m   Physical Exam Vitals reviewed.  Constitutional:      General: She is not in acute distress.    Appearance: She is ill-appearing.  Pulmonary:     Effort: Pulmonary effort is normal.  Skin:    General: Skin is warm and dry.  Neurological:     Mental Status: She is alert  and oriented to person, place, and time.  Psychiatric:        Mood and Affect: Mood normal.        Behavior: Behavior normal.        Thought Content: Thought content normal.        Judgment: Judgment normal.   Right PCN in place with ~100 mL bloody urine noted in bag at time of exam. Drain insertion site dressed appropriately, suture and stat lock are in place. No bleeding, drainage, erythema, or induration noted at drain insertion site.  Imaging: IR NEPHROSTOMY PLACEMENT RIGHT  Result Date: 11/07/2022 INDICATION: 47 year old female with history of previously placed right ureteral stent. Unfortunately, patient has not undergone routine follow-up and the distal loop of the stent is completely encased in a large bladder stone. She now has severely obstructed pyonephrosis of the right kidney and presents for percutaneous nephrostomy tube placement. EXAM: IR NEPHROSTOMY PLACEMENT RIGHT COMPARISON:  CT scan of the abdomen and pelvis 11/04/2022 MEDICATIONS: In patient currently receiving antibiotic therapy. No additional prophylaxis was administered. ANESTHESIA/SEDATION: Fentanyl 100 mcg IV; Versed 2 mg IV Moderate Sedation Time:  16 minutes The patient's vital signs and level of consciousness were continuously monitored during the procedure by the interventional radiology nurse under my direct supervision. CONTRAST:  10mL OMNIPAQUE IOHEXOL 300 MG/ML SOLN - administered into the collecting system(s) FLUOROSCOPY: Radiation exposure index: 22  mGy reference air kerma COMPLICATIONS: None immediate. TECHNIQUE: The procedure, risks, benefits, and alternatives were explained to the patient. Questions regarding the procedure were encouraged and answered. The patient understands and consents to the procedure. The right flank was prepped with chlorhexidine in a sterile fashion, and a sterile drape was applied covering the operative field. A sterile gown and sterile gloves were used for the procedure. Local anesthesia  was provided with 1% Lidocaine. The right flank was interrogated with ultrasound and the left kidney identified. The kidney is severely hydronephrotic and the retained urine is highly complex in appearance. A suitable access site on the skin overlying the lower pole, posterior calix was identified. After local mg anesthesia was achieved, a small skin nick was made with an 11 blade scalpel. A 21 gauge Accustick needle was then advanced under direct sonographic guidance into the lower pole of the right kidney. A 0.018 inch wire was advanced under fluoroscopic guidance into the left renal collecting system. There was return of grossly purulent urine. The Accustick sheath was then advanced over the wire and a 0.018 system exchanged for a 0.035 system. The tract from the scan into the renal collecting system was then dilated serially to 10-French. A 10-French Cook all-purpose drain was then placed and positioned under fluoroscopic guidance. The locking loop is well formed within the renal pelvis. Several 100 mL of frankly purulent fluid was then aspirated from the renal collecting system. A sample was sent for Gram stain and culture. A gentle hand injection of contrast material was then performed. The contrast material outlines the highly irregular renal collecting system. The catheter was secured to the skin with 2-0 Prolene and a sterile bandage was placed. Catheter was left to gravity bag drainage. IMPRESSION: Successful placement of a right 10 French percutaneous nephrostomy tube. The urine is grossly purulent and a sample was sent for Gram stain and culture. Electronically Signed   By: Malachy Moan M.D.   On: 11/07/2022 12:58    Labs:  CBC: Recent Labs    11/25/21 0208 11/04/22 0725 11/05/22 0530 11/06/22 1002 11/07/22 1544 11/07/22 2130 11/08/22 0326  WBC 10.3 12.9* 10.8* 10.3  --   --   --   HGB 12.3 11.2* 9.9* 9.5* 10.3* 10.1* 8.9*  HCT 37.4 34.3* 29.9* 29.0* 31.0* 30.5* 27.9*  PLT 333 428*  369 338  --   --   --     COAGS: Recent Labs    11/05/22 0530  INR 1.4*    BMP: Recent Labs    11/05/22 0530 11/06/22 1002 11/07/22 0624 11/08/22 0854  NA 136 131* 136 133*  K 2.9* 3.1* 3.6 3.5  CL 103 98 103 99  CO2 24 25 25 25   GLUCOSE 141* 181* 134* 140*  BUN 10 13 18 18   CALCIUM 8.0* 8.1* 8.3* 8.2*  CREATININE 0.87 0.83 0.87 0.98  GFRNONAA >60 >60 >60 >60    LIVER FUNCTION TESTS: Recent Labs    11/25/21 0208 11/04/22 0725  BILITOT 0.6 0.9  AST 15 17  ALT 14 10  ALKPHOS 82 84  PROT 8.6* 8.4*  ALBUMIN 3.4* 2.8*    Assessment and Plan:  Kathy Walton is a 47 yo female who is s/p right PCN placement for pyelonephritis and right hydronephrosis. Patient has downward trending Hgb of 8.9 today down from 10.1 yesterday. She also continues to have bloody output from her drain, with 560 mL of total output noted. Culture is being reincubated but initially showed paired Gram  positive cocci in clusters. Patient is leaving AMA today despite discussion of risks including bleeding, infection, and deterioration of her overall status. Patient understands risks of leaving AMA but voices that she is leaving regardless. Patient was educated on drain care, and advised to return if she develops fever, chills, pain at drain site, change in drain output, continued bleeding from drain, feelings of weakness, or altered mental status. Patient verbalized her understanding. Orders for follow-up have been placed.  Electronically Signed: Kennieth Francois, PA-C 11/08/2022, 12:00 PM   I spent a total of 25 Minutes at the the patient's bedside AND on the patient's hospital floor or unit, greater than 50% of which was counseling/coordinating care for pyelonephritis and right hydronephrosis.

## 2022-11-08 NOTE — Consult Note (Signed)
PHARMACY CONSULT NOTE - ELECTROLYTES  Pharmacy Consult for Electrolyte Monitoring and Replacement   Recent Labs: Potassium (mmol/L)  Date Value  11/08/2022 3.5  02/27/2014 4.0   Magnesium (mg/dL)  Date Value  09/81/1914 2.0   Calcium (mg/dL)  Date Value  78/29/5621 8.2 (L)   Calcium, Total (mg/dL)  Date Value  30/86/5784 9.0   Albumin (g/dL)  Date Value  69/62/9528 2.8 (L)  02/27/2014 3.4   Phosphorus (mg/dL)  Date Value  41/32/4401 3.6   Sodium (mmol/L)  Date Value  11/08/2022 133 (L)  02/27/2014 134 (L)   Corrected Ca: 9.0 mg/dL  Assessment  Kathy Walton is a 47 y.o. female presenting with R-sided flank pain. PMH significant for kidney stones, R-sided ureteral stent placement. Pharmacy has been consulted to monitor and replace potassium.  Diet: regular MIVF: none  Goal of Therapy: Electrolytes WNL  Plan:  K within normal limits this morning. No replacement needed at this time Will recheck electrolytes with AM labs tomorrow  Thank you for allowing pharmacy to be a part of this patient's care.  Gardner Candle, PharmD, BCPS Clinical Pharmacist 11/08/2022 10:03 AM

## 2022-11-08 NOTE — Discharge Summary (Addendum)
Physician Discharge Summary   Patient: Kathy Walton MRN: [id number] DOB: 1975/11/07  Admit date:     11/04/2022  Discharge date: Pt left AMA on 5/21  Discharge Physician: Enedina Finner   PCP: [name]   Patient left AMA.  Patient was admitted with sepsis due to UTI and chronic hydronephrosis of the right kidney with chronic ureteral stent placed in 2021 with a bladder stone. --Patient was admitted and started on IV fluids and IV antibiotic. She had high-grade fevers. Urology was consulted. Continues to spike fever and hence decision was made for IR to place right kidney nephrostomy  drain. -- Patient tolerated the procedure well. Did have blood in urine and had significant amount of purulent drainage per IR findings. -- Patient required IV  and PO pain meds. -- Patient's right kidney drain culture grew rare gram-positive cocci in clusters.  Patient did not want to wait till seen by infectious disease. -- Given significant amount of drainage and severe hydronephrosis I did send in prescription for Levaquin for seven days. -- Patient knows she has appointment with [facility] on June 3 -- she understands the risk and complications involved with leaving AMA.  No family was at bedside this morning.  The results of significant diagnostics from this hospitalization (including imaging, microbiology, ancillary and laboratory) are listed below for reference.   Imaging Studies: IR NEPHROSTOMY PLACEMENT RIGHT  Result Date: 11/07/2022 INDICATION: 47 year old female with history of previously placed right ureteral stent. Unfortunately, patient has not undergone routine follow-up and the distal loop of the stent is completely encased in a large bladder stone. She now has severely obstructed pyonephrosis of the right kidney and presents for percutaneous nephrostomy tube placement. EXAM: IR NEPHROSTOMY PLACEMENT RIGHT COMPARISON:  CT scan of the abdomen and pelvis 11/04/2022 MEDICATIONS: In  patient currently receiving antibiotic therapy. No additional prophylaxis was administered. ANESTHESIA/SEDATION: Fentanyl 100 mcg IV; Versed 2 mg IV Moderate Sedation Time:  16 minutes The patient's vital signs and level of consciousness were continuously monitored during the procedure by the interventional radiology nurse under my direct supervision. CONTRAST:  10mL OMNIPAQUE IOHEXOL 300 MG/ML SOLN - administered into the collecting system(s) FLUOROSCOPY: Radiation exposure index: 22 mGy reference air kerma COMPLICATIONS: None immediate. TECHNIQUE: The procedure, risks, benefits, and alternatives were explained to the patient. Questions regarding the procedure were encouraged and answered. The patient understands and consents to the procedure. The right flank was prepped with chlorhexidine in a sterile fashion, and a sterile drape was applied covering the operative field. A sterile gown and sterile gloves were used for the procedure. Local anesthesia was provided with 1% Lidocaine. The right flank was interrogated with ultrasound and the left kidney identified. The kidney is severely hydronephrotic and the retained urine is highly complex in appearance. A suitable access site on the skin overlying the lower pole, posterior calix was identified. After local mg anesthesia was achieved, a small skin nick was made with an 11 blade scalpel. A 21 gauge Accustick needle was then advanced under direct sonographic guidance into the lower pole of the right kidney. A 0.018 inch wire was advanced under fluoroscopic guidance into the left renal collecting system. There was return of grossly purulent urine. The Accustick sheath was then advanced over the wire and a 0.018 system exchanged for a 0.035 system. The tract from the scan into the renal collecting system was then dilated serially to 10-French. A 10-French Cook all-purpose drain was then placed and positioned under fluoroscopic guidance.  The locking loop is well formed  within the renal pelvis. Several 100 mL of frankly purulent fluid was then aspirated from the renal collecting system. A sample was sent for Gram stain and culture. A gentle hand injection of contrast material was then performed. The contrast material outlines the highly irregular renal collecting system. The catheter was secured to the skin with 2-0 Prolene and a sterile bandage was placed. Catheter was left to gravity bag drainage. IMPRESSION: Successful placement of a right 10 French percutaneous nephrostomy tube. The urine is grossly purulent and a sample was sent for Gram stain and culture. Electronically Signed   By: Malachy Moan M.D.   On: 11/07/2022 12:58   CT Renal Stone Study  Result Date: 11/04/2022 CLINICAL DATA:  Right flank pain. Ureteral calculi and stent placement. EXAM: CT ABDOMEN AND PELVIS WITHOUT CONTRAST TECHNIQUE: Multidetector CT imaging of the abdomen and pelvis was performed following the standard protocol without IV contrast. RADIATION DOSE REDUCTION: This exam was performed according to the departmental dose-optimization program which includes automated exposure control, adjustment of the mA and/or kV according to patient size and/or use of iterative reconstruction technique. COMPARISON:  11/25/2021 FINDINGS: Lower chest: New bandlike opacities in the right mid and lower lung, which may be due to scarring or atelectasis. Hepatobiliary: No mass visualized on this unenhanced exam. Prior cholecystectomy. No evidence of biliary obstruction. Pancreas: No mass or inflammatory process visualized on this unenhanced exam. Spleen:  Within normal limits in size. Adrenals/Urinary tract: 3 mm nonobstructing calculus is seen in the lower pole of the left kidney, without evidence of left-sided hydronephrosis. Diffuse right renal swelling and mild perinephric stranding shows no significant change. Severe right hydronephrosis is also unchanged. A right ureteral stent is seen in place, with the  proximal pigtail loop in the upper to mid ureter, and showing involvement by a calculus within the proximal pigtail of the stent measuring 1.4 x 0.7 cm. A 5 mm calculus is seen adjacent to the stent in the right mid ureter on image 71/2. The distal pigtail of the stent is seen in the urinary bladder and is encompassed by a large bladder calculus measuring 5.3 x 4.0 cm. Stomach/Bowel: No evidence of obstruction, inflammatory process, or abnormal fluid collections. Vascular/Lymphatic: Mild retroperitoneal lymphadenopathy is seen in the retrocaval and aortocaval spaces, which is unchanged, with index lymph node measuring 1.6 cm on image 42/2. No evidence of abdominal aortic aneurysm. Reproductive: Uterus is unremarkable. A small cystic lesion is seen in the right adnexa which measures 4.1 x 2.5 cm, mildly increased in size from 3.1 x 2.5 cm on previous study. No evidence of free fluid. Other:  None. Musculoskeletal:  No suspicious bone lesions identified. IMPRESSION: No significant change in severe right hydronephrosis, right renal swelling, and perinephric stranding. Chronic pyelonephritis cannot be excluded. Malpositioning of right ureteral stent, with proximal pigtail loop in the proximal to mid right ureter. 1.4 cm calculus within the proximal pigtail of the right ureteral stent, and 5 mm calculus adjacent to the stent in the right mid ureter. 5.3 cm bladder calculus, which encompasses the distal pigtail loop of the ureteral stent. Stable mild retroperitoneal lymphadenopathy, likely reactive in etiology. 3 mm nonobstructing left renal calculus. 4.1 cm simple appearing cystic lesion in the right adnexa. No follow-up imaging recommended. Note: This recommendation does not apply to premenarchal patients and to those with increased risk (genetic, family history, elevated tumor markers or other high-risk factors) of ovarian cancer. Reference: JACR 2020 Feb; 17(2):248-254 New  right mid and lower lung scarring or  atelectasis. Electronically Signed   By: Danae Orleans M.D.   On: 11/04/2022 09:41    Microbiology: Results for orders placed or performed during the hospital encounter of 11/04/22  Urine Culture     Status: Abnormal   Collection Time: 11/04/22  7:25 AM   Specimen: Urine, Clean Catch  Result Value Ref Range Status   Specimen Description   Final    URINE, CLEAN CATCH Performed at [facility], [address], [place], [place] [address]    Special Requests   Final    NONE Performed at [facility], [address] [place], [place] [address]    Culture MULTIPLE SPECIES PRESENT, SUGGEST RECOLLECTION (A)  Final   Report Status 11/06/2022 FINAL  Final  Culture, blood (Routine X 2) w Reflex to ID Panel     Status: None (Preliminary result)   Collection Time: 11/04/22  3:23 PM   Specimen: BLOOD  Result Value Ref Range Status   Specimen Description BLOOD RIGHT [facility]  Final   Special Requests   Final    BOTTLES DRAWN AEROBIC AND ANAEROBIC Blood Culture adequate volume   Culture   Final    NO GROWTH 4 DAYS Performed at [facility], [address], [place], [place] [address]    Report Status PENDING  Incomplete  Culture, blood (Routine X 2) w Reflex to ID Panel     Status: None (Preliminary result)   Collection Time: 11/04/22  3:26 PM   Specimen: BLOOD  Result Value Ref Range Status   Specimen Description BLOOD RIGHT FOREARM  Final   Special Requests   Final    BOTTLES DRAWN AEROBIC AND ANAEROBIC Blood Culture results may not be optimal due to an excessive volume of blood received in culture bottles   Culture   Final    NO GROWTH 4 DAYS Performed at [facility], [address] [place], [place] [address]    Report Status PENDING  Incomplete  Aerobic/Anaerobic Culture w Gram Stain (surgical/deep wound)     Status: None (Preliminary result)   Collection Time: 11/07/22 12:24 PM   Specimen: Abscess  Result Value Ref Range Status   Specimen  Description   Final    ABSCESS Performed at [facility], [address], [place], [place] [address]    Special Requests   Final    Normal Performed at [facility], [address] [place], [place] [address]    Gram Stain   Final    ABUNDANT WBC PRESENT, PREDOMINANTLY PMN RARE GRAM POSITIVE COCCI IN CLUSTERS    Culture   Final    CULTURE REINCUBATED FOR BETTER GROWTH Performed at [facility], [address], [place], [place] [address]    Report Status PENDING  Incomplete    Labs: CBC: Recent Labs  Lab 11/04/22 0725 11/05/22 0530 11/06/22 1002 11/07/22 1544 11/07/22 2130 11/08/22 0326  WBC 12.9* 10.8* 10.3  --   --   --   HGB 11.2* 9.9* 9.5* 10.3* 10.1* 8.9*  HCT 34.3* 29.9* 29.0* 31.0* 30.5* 27.9*  MCV 83.9 82.6 83.6  --   --   --   PLT 428* 369 338  --   --   --    Basic Metabolic Panel: Recent Labs  Lab 11/04/22 0725 11/05/22 0526 11/05/22 0530 11/06/22 1002 11/07/22 0624 11/08/22 0854  NA 135  --  136 131* 136 133*  K 3.2*  --  2.9* 3.1* 3.6 3.5  CL 100  --  103 98 103 99  CO2 25  --  24 25 25 25   GLUCOSE 125*  --  141* 181* 134* 140*  BUN 10  --  10 13 18 18   CREATININE 0.91  --  0.87 0.83 0.87 0.98  CALCIUM 8.3*  --  8.0* 8.1* 8.3* 8.2*  MG 2.0 1.8  --   --  2.0  --   PHOS  --   --   --   --  3.6  --    Liver Function Tests: Recent Labs  Lab 11/04/22 0725  AST 17  ALT 10  ALKPHOS 84  BILITOT 0.9  PROT 8.4*  ALBUMIN 2.8*   Discharge time spent: less than 30 minutes.  Signed: Enedina Finner, MD [facility] 11/08/2022

## 2022-11-09 LAB — CULTURE, BLOOD (ROUTINE X 2): Culture: NO GROWTH

## 2022-11-09 LAB — AEROBIC/ANAEROBIC CULTURE W GRAM STAIN (SURGICAL/DEEP WOUND)

## 2022-11-10 ENCOUNTER — Inpatient Hospital Stay: Payer: Medicaid Other | Admitting: Infectious Diseases

## 2022-11-10 LAB — AEROBIC/ANAEROBIC CULTURE W GRAM STAIN (SURGICAL/DEEP WOUND)

## 2022-11-11 LAB — AEROBIC/ANAEROBIC CULTURE W GRAM STAIN (SURGICAL/DEEP WOUND)

## 2022-11-21 DIAGNOSIS — N200 Calculus of kidney: Secondary | ICD-10-CM | POA: Diagnosis not present

## 2022-11-28 DIAGNOSIS — D649 Anemia, unspecified: Secondary | ICD-10-CM | POA: Diagnosis not present

## 2022-11-28 DIAGNOSIS — N132 Hydronephrosis with renal and ureteral calculous obstruction: Secondary | ICD-10-CM | POA: Diagnosis not present

## 2022-11-28 DIAGNOSIS — N1330 Unspecified hydronephrosis: Secondary | ICD-10-CM | POA: Diagnosis not present

## 2022-11-28 DIAGNOSIS — Y838 Other surgical procedures as the cause of abnormal reaction of the patient, or of later complication, without mention of misadventure at the time of the procedure: Secondary | ICD-10-CM | POA: Diagnosis not present

## 2022-11-28 DIAGNOSIS — N3289 Other specified disorders of bladder: Secondary | ICD-10-CM | POA: Diagnosis not present

## 2022-11-28 DIAGNOSIS — N210 Calculus in bladder: Secondary | ICD-10-CM | POA: Diagnosis not present

## 2022-11-28 DIAGNOSIS — Z87891 Personal history of nicotine dependence: Secondary | ICD-10-CM | POA: Diagnosis not present

## 2022-11-28 DIAGNOSIS — T83022A Displacement of nephrostomy catheter, initial encounter: Secondary | ICD-10-CM | POA: Diagnosis not present

## 2022-11-28 DIAGNOSIS — N200 Calculus of kidney: Secondary | ICD-10-CM | POA: Diagnosis not present

## 2022-11-28 DIAGNOSIS — N99522 Malfunction of other external stoma of urinary tract: Secondary | ICD-10-CM | POA: Diagnosis not present

## 2022-11-28 DIAGNOSIS — R5383 Other fatigue: Secondary | ICD-10-CM | POA: Diagnosis not present

## 2022-11-28 DIAGNOSIS — M069 Rheumatoid arthritis, unspecified: Secondary | ICD-10-CM | POA: Diagnosis not present

## 2022-11-28 DIAGNOSIS — Z01818 Encounter for other preprocedural examination: Secondary | ICD-10-CM | POA: Diagnosis not present

## 2022-12-02 ENCOUNTER — Telehealth: Payer: Medicaid Other | Admitting: Family Medicine

## 2022-12-02 DIAGNOSIS — N390 Urinary tract infection, site not specified: Secondary | ICD-10-CM | POA: Diagnosis not present

## 2022-12-02 MED ORDER — NITROFURANTOIN MONOHYD MACRO 100 MG PO CAPS: 100.0000 mg | ORAL_CAPSULE | Freq: Two times a day (BID) | ORAL | 0 refills | Status: AC

## 2022-12-02 NOTE — Progress Notes (Signed)
E-Visit for Urinary Problems  We are sorry that you are not feeling well.  Here is how we plan to help!  Based on what you shared with me it looks like you most likely have a simple urinary tract infection.  A UTI (Urinary Tract Infection) is a bacterial infection of the bladder.  Most cases of urinary tract infections are simple to treat but a key part of your care is to encourage you to drink plenty of fluids and watch your symptoms carefully.  I have prescribed MacroBid 100 mg twice a day for 5 days.  Your symptoms should gradually improve. Call us if the burning in your urine worsens, you develop worsening fever, back pain or pelvic pain or if your symptoms do not resolve after completing the antibiotic.  Urinary tract infections can be prevented by drinking plenty of water to keep your body hydrated.  Also be sure when you wipe, wipe from front to back and don't hold it in!  If possible, empty your bladder every 4 hours.  HOME CARE Drink plenty of fluids Compete the full course of the antibiotics even if the symptoms resolve Remember, when you need to go.go. Holding in your urine can increase the likelihood of getting a UTI! GET HELP RIGHT AWAY IF: You cannot urinate You get a high fever Worsening back pain occurs You see blood in your urine You feel sick to your stomach or throw up You feel like you are going to pass out  MAKE SURE YOU  Understand these instructions. Will watch your condition. Will get help right away if you are not doing well or get worse.   Thank you for choosing an e-visit.  Your e-visit answers were reviewed by a board certified advanced clinical practitioner to complete your personal care plan. Depending upon the condition, your plan could have included both over the counter or prescription medications.  Please review your pharmacy choice. Make sure the pharmacy is open so you can pick up prescription now. If there is a problem, you may contact your  provider through MyChart messaging and have the prescription routed to another pharmacy.  Your safety is important to us. If you have drug allergies check your prescription carefully.   For the next 24 hours you can use MyChart to ask questions about today's visit, request a non-urgent call back, or ask for a work or school excuse. You will get an email in the next two days asking about your experience. I hope that your e-visit has been valuable and will speed your recovery.    have provided 5 minutes of non face to face time during this encounter for chart review and documentation.   

## 2022-12-03 ENCOUNTER — Ambulatory Visit: Payer: Medicaid Other

## 2023-01-29 ENCOUNTER — Telehealth: Payer: Medicaid Other | Admitting: Family

## 2023-01-29 DIAGNOSIS — K0889 Other specified disorders of teeth and supporting structures: Secondary | ICD-10-CM

## 2023-01-29 MED ORDER — CLINDAMYCIN HCL 300 MG PO CAPS
300.0000 mg | ORAL_CAPSULE | Freq: Three times a day (TID) | ORAL | 0 refills | Status: AC
Start: 2023-01-29 — End: 2023-02-05

## 2023-01-29 NOTE — Progress Notes (Signed)

## 2023-02-25 ENCOUNTER — Other Ambulatory Visit: Payer: Self-pay | Admitting: Family

## 2023-02-25 NOTE — Addendum Note (Signed)
Addended by: Jannifer Rodney A on: 02/25/2023 07:36 AM   Modules accepted: Level of Service

## 2023-03-02 DIAGNOSIS — E871 Hypo-osmolality and hyponatremia: Secondary | ICD-10-CM | POA: Diagnosis not present

## 2023-03-02 DIAGNOSIS — N12 Tubulo-interstitial nephritis, not specified as acute or chronic: Secondary | ICD-10-CM | POA: Diagnosis not present

## 2023-03-02 DIAGNOSIS — N136 Pyonephrosis: Secondary | ICD-10-CM | POA: Diagnosis not present

## 2023-03-02 DIAGNOSIS — Z87448 Personal history of other diseases of urinary system: Secondary | ICD-10-CM | POA: Diagnosis not present

## 2023-03-02 DIAGNOSIS — R590 Localized enlarged lymph nodes: Secondary | ICD-10-CM | POA: Diagnosis not present

## 2023-03-02 DIAGNOSIS — R9431 Abnormal electrocardiogram [ECG] [EKG]: Secondary | ICD-10-CM | POA: Diagnosis not present

## 2023-03-02 DIAGNOSIS — E876 Hypokalemia: Secondary | ICD-10-CM | POA: Diagnosis not present

## 2023-04-13 ENCOUNTER — Other Ambulatory Visit: Payer: Self-pay

## 2023-04-13 ENCOUNTER — Emergency Department: Payer: Medicaid Other

## 2023-04-13 ENCOUNTER — Inpatient Hospital Stay: Payer: Medicaid Other

## 2023-04-13 ENCOUNTER — Inpatient Hospital Stay
Admission: EM | Admit: 2023-04-13 | Discharge: 2023-04-21 | DRG: 872 | Disposition: A | Discharge status: Home / Self Care | Payer: Medicaid Other | Attending: Internal Medicine | Admitting: Internal Medicine

## 2023-04-13 DIAGNOSIS — E876 Hypokalemia: Secondary | ICD-10-CM | POA: Diagnosis present

## 2023-04-13 DIAGNOSIS — R161 Splenomegaly, not elsewhere classified: Secondary | ICD-10-CM | POA: Diagnosis not present

## 2023-04-13 DIAGNOSIS — Z1624 Resistance to multiple antibiotics: Secondary | ICD-10-CM | POA: Diagnosis present

## 2023-04-13 DIAGNOSIS — Z960 Presence of urogenital implants: Secondary | ICD-10-CM

## 2023-04-13 DIAGNOSIS — Z87891 Personal history of nicotine dependence: Secondary | ICD-10-CM

## 2023-04-13 DIAGNOSIS — R112 Nausea with vomiting, unspecified: Secondary | ICD-10-CM | POA: Diagnosis not present

## 2023-04-13 DIAGNOSIS — R932 Abnormal findings on diagnostic imaging of liver and biliary tract: Secondary | ICD-10-CM | POA: Diagnosis not present

## 2023-04-13 DIAGNOSIS — N261 Atrophy of kidney (terminal): Secondary | ICD-10-CM | POA: Diagnosis present

## 2023-04-13 DIAGNOSIS — Z813 Family history of other psychoactive substance abuse and dependence: Secondary | ICD-10-CM | POA: Diagnosis not present

## 2023-04-13 DIAGNOSIS — Z87440 Personal history of urinary (tract) infections: Secondary | ICD-10-CM | POA: Diagnosis not present

## 2023-04-13 DIAGNOSIS — Z936 Other artificial openings of urinary tract status: Secondary | ICD-10-CM | POA: Diagnosis not present

## 2023-04-13 DIAGNOSIS — Z9889 Other specified postprocedural states: Secondary | ICD-10-CM | POA: Diagnosis not present

## 2023-04-13 DIAGNOSIS — K8050 Calculus of bile duct without cholangitis or cholecystitis without obstruction: Secondary | ICD-10-CM | POA: Diagnosis not present

## 2023-04-13 DIAGNOSIS — N10 Acute tubulo-interstitial nephritis: Principal | ICD-10-CM | POA: Diagnosis present

## 2023-04-13 DIAGNOSIS — N210 Calculus in bladder: Secondary | ICD-10-CM | POA: Diagnosis present

## 2023-04-13 DIAGNOSIS — Z87442 Personal history of urinary calculi: Secondary | ICD-10-CM

## 2023-04-13 DIAGNOSIS — D638 Anemia in other chronic diseases classified elsewhere: Secondary | ICD-10-CM | POA: Diagnosis present

## 2023-04-13 DIAGNOSIS — Z818 Family history of other mental and behavioral disorders: Secondary | ICD-10-CM

## 2023-04-13 DIAGNOSIS — F419 Anxiety disorder, unspecified: Secondary | ICD-10-CM | POA: Diagnosis present

## 2023-04-13 DIAGNOSIS — R079 Chest pain, unspecified: Secondary | ICD-10-CM | POA: Diagnosis present

## 2023-04-13 DIAGNOSIS — R6520 Severe sepsis without septic shock: Secondary | ICD-10-CM | POA: Diagnosis not present

## 2023-04-13 DIAGNOSIS — A4151 Sepsis due to Escherichia coli [E. coli]: Principal | ICD-10-CM | POA: Diagnosis present

## 2023-04-13 DIAGNOSIS — Z9049 Acquired absence of other specified parts of digestive tract: Secondary | ICD-10-CM

## 2023-04-13 DIAGNOSIS — B9620 Unspecified Escherichia coli [E. coli] as the cause of diseases classified elsewhere: Secondary | ICD-10-CM | POA: Diagnosis not present

## 2023-04-13 DIAGNOSIS — E039 Hypothyroidism, unspecified: Secondary | ICD-10-CM | POA: Diagnosis present

## 2023-04-13 DIAGNOSIS — Z803 Family history of malignant neoplasm of breast: Secondary | ICD-10-CM | POA: Diagnosis not present

## 2023-04-13 DIAGNOSIS — Z8249 Family history of ischemic heart disease and other diseases of the circulatory system: Secondary | ICD-10-CM | POA: Diagnosis not present

## 2023-04-13 DIAGNOSIS — K838 Other specified diseases of biliary tract: Secondary | ICD-10-CM | POA: Diagnosis not present

## 2023-04-13 DIAGNOSIS — R7881 Bacteremia: Secondary | ICD-10-CM | POA: Diagnosis not present

## 2023-04-13 DIAGNOSIS — N202 Calculus of kidney with calculus of ureter: Secondary | ICD-10-CM | POA: Diagnosis present

## 2023-04-13 DIAGNOSIS — M069 Rheumatoid arthritis, unspecified: Secondary | ICD-10-CM | POA: Diagnosis present

## 2023-04-13 DIAGNOSIS — N6310 Unspecified lump in the right breast, unspecified quadrant: Secondary | ICD-10-CM | POA: Diagnosis present

## 2023-04-13 DIAGNOSIS — F1911 Other psychoactive substance abuse, in remission: Secondary | ICD-10-CM | POA: Diagnosis present

## 2023-04-13 DIAGNOSIS — R109 Unspecified abdominal pain: Secondary | ICD-10-CM | POA: Diagnosis not present

## 2023-04-13 DIAGNOSIS — Z881 Allergy status to other antibiotic agents status: Secondary | ICD-10-CM

## 2023-04-13 DIAGNOSIS — N1330 Unspecified hydronephrosis: Secondary | ICD-10-CM

## 2023-04-13 DIAGNOSIS — R7989 Other specified abnormal findings of blood chemistry: Secondary | ICD-10-CM | POA: Diagnosis not present

## 2023-04-13 DIAGNOSIS — A419 Sepsis, unspecified organism: Secondary | ICD-10-CM | POA: Diagnosis present

## 2023-04-13 DIAGNOSIS — N179 Acute kidney failure, unspecified: Secondary | ICD-10-CM | POA: Diagnosis present

## 2023-04-13 DIAGNOSIS — I10 Essential (primary) hypertension: Secondary | ICD-10-CM | POA: Diagnosis present

## 2023-04-13 LAB — COMPREHENSIVE METABOLIC PANEL
ALT: 63 U/L — ABNORMAL HIGH (ref 0–44)
AST: 123 U/L — ABNORMAL HIGH (ref 15–41)
Albumin: 3.1 g/dL — ABNORMAL LOW (ref 3.5–5.0)
Alkaline Phosphatase: 319 U/L — ABNORMAL HIGH (ref 38–126)
Anion gap: 13 (ref 5–15)
BUN: 9 mg/dL (ref 6–20)
CO2: 23 mmol/L (ref 22–32)
Calcium: 8.8 mg/dL — ABNORMAL LOW (ref 8.9–10.3)
Chloride: 98 mmol/L (ref 98–111)
Creatinine, Ser: 1.14 mg/dL — ABNORMAL HIGH (ref 0.44–1.00)
GFR, Estimated: >60 mL/min (ref >60)
Glucose, Bld: 162 mg/dL — ABNORMAL HIGH (ref 70–99)
Potassium: 3.2 mmol/L — ABNORMAL LOW (ref 3.5–5.1)
Sodium: 134 mmol/L — ABNORMAL LOW (ref 135–145)
Total Bilirubin: 3.7 mg/dL — ABNORMAL HIGH (ref 0.3–1.2)
Total Protein: 8.9 g/dL — ABNORMAL HIGH (ref 6.5–8.1)

## 2023-04-13 LAB — URINALYSIS, ROUTINE W REFLEX MICROSCOPIC
Bilirubin Urine: NEGATIVE
Glucose, UA: NEGATIVE mg/dL
Ketones, ur: NEGATIVE mg/dL
Nitrite: POSITIVE — AB
Protein, ur: 100 mg/dL — AB
Specific Gravity, Urine: 1.01 (ref 1.005–1.030)
WBC, UA: >50 WBC/hpf (ref 0–5)
pH: 7 (ref 5.0–8.0)

## 2023-04-13 LAB — CBC WITH DIFFERENTIAL/PLATELET
Abs Immature Granulocytes: 0.06 10*3/uL (ref 0.00–0.07)
Basophils Absolute: 0 10*3/uL (ref 0.0–0.1)
Basophils Relative: 0 %
Eosinophils Absolute: 0 10*3/uL (ref 0.0–0.5)
Eosinophils Relative: 0 %
HCT: 33.7 % — ABNORMAL LOW (ref 36.0–46.0)
Hemoglobin: 11 g/dL — ABNORMAL LOW (ref 12.0–15.0)
Immature Granulocytes: 1 %
Lymphocytes Relative: 9 %
Lymphs Abs: 1.2 10*3/uL (ref 0.7–4.0)
MCH: 27 pg (ref 26.0–34.0)
MCHC: 32.6 g/dL (ref 30.0–36.0)
MCV: 82.8 fL (ref 80.0–100.0)
Monocytes Absolute: 0.5 10*3/uL (ref 0.1–1.0)
Monocytes Relative: 4 %
Neutro Abs: 11.4 10*3/uL — ABNORMAL HIGH (ref 1.7–7.7)
Neutrophils Relative %: 86 %
Platelets: 481 10*3/uL — ABNORMAL HIGH (ref 150–400)
RBC: 4.07 MIL/uL (ref 3.87–5.11)
RDW: 17.1 % — ABNORMAL HIGH (ref 11.5–15.5)
WBC: 13.2 10*3/uL — ABNORMAL HIGH (ref 4.0–10.5)
nRBC: 0 % (ref 0.0–0.2)

## 2023-04-13 LAB — LIPASE, BLOOD: Lipase: 21 U/L (ref 11–51)

## 2023-04-13 LAB — LACTIC ACID, PLASMA
Lactic Acid, Venous: 1 mmol/L (ref 0.5–1.9)
Lactic Acid, Venous: 0.9 mmol/L (ref 0.5–1.9)

## 2023-04-13 LAB — PREGNANCY, URINE: Preg Test, Ur: NEGATIVE

## 2023-04-13 MED ORDER — ACETAMINOPHEN 650 MG RE SUPP
650.0000 mg | Freq: Four times a day (QID) | RECTAL | Status: DC | PRN
Start: 2023-04-13 — End: 2023-04-18

## 2023-04-13 MED ORDER — ONDANSETRON HCL 4 MG/2ML IJ SOLN
4.0000 mg | Freq: Once | INTRAMUSCULAR | Status: AC
  Administered 2023-04-13: 4 mg via INTRAVENOUS
  Filled 2023-04-13: qty 2

## 2023-04-13 MED ORDER — SODIUM CHLORIDE 0.9 % IV SOLN
2.0000 g | Freq: Two times a day (BID) | INTRAVENOUS | Status: DC
  Administered 2023-04-14: 2 g via INTRAVENOUS
  Filled 2023-04-13 (×2): qty 12.5

## 2023-04-13 MED ORDER — ONDANSETRON HCL 4 MG PO TABS: 4.0000 mg | ORAL_TABLET | Freq: Four times a day (QID) | ORAL | Status: DC | PRN

## 2023-04-13 MED ORDER — IOHEXOL 300 MG/ML  SOLN
100.0000 mL | Freq: Once | INTRAMUSCULAR | Status: AC | PRN
Start: 2023-04-13 — End: 2023-04-13
  Administered 2023-04-13: 100 mL via INTRAVENOUS

## 2023-04-13 MED ORDER — ACETAMINOPHEN 325 MG PO TABS
650.0000 mg | ORAL_TABLET | Freq: Four times a day (QID) | ORAL | Status: DC | PRN
  Administered 2023-04-17 – 2023-04-18 (×2): 650 mg via ORAL
  Filled 2023-04-13 (×2): qty 2

## 2023-04-13 MED ORDER — MORPHINE SULFATE (PF) 4 MG/ML IV SOLN
4.0000 mg | INTRAVENOUS | Status: DC | PRN
  Administered 2023-04-13: 4 mg via INTRAVENOUS
  Filled 2023-04-13: qty 1

## 2023-04-13 MED ORDER — SODIUM CHLORIDE 0.9 % IV SOLN
2.0000 g | Freq: Once | INTRAVENOUS | Status: AC
  Administered 2023-04-13: 2 g via INTRAVENOUS
  Filled 2023-04-13: qty 12.5

## 2023-04-13 MED ORDER — METOCLOPRAMIDE HCL 5 MG/ML IJ SOLN
10.0000 mg | Freq: Once | INTRAMUSCULAR | Status: AC
Start: 2023-04-13 — End: 2023-04-13
  Administered 2023-04-13: 10 mg via INTRAVENOUS
  Filled 2023-04-13: qty 2

## 2023-04-13 MED ORDER — ONDANSETRON HCL 4 MG/2ML IJ SOLN: 4.0000 mg | Freq: Four times a day (QID) | INTRAMUSCULAR | Status: DC | PRN

## 2023-04-13 MED ORDER — HYDROCODONE-ACETAMINOPHEN 5-325 MG PO TABS
1.0000 | ORAL_TABLET | ORAL | Status: DC | PRN
  Administered 2023-04-14 (×3): 2 via ORAL
  Administered 2023-04-15 (×2): 1 via ORAL
  Administered 2023-04-15 (×2): 2 via ORAL
  Administered 2023-04-16: 1 via ORAL
  Administered 2023-04-16: 2 via ORAL
  Filled 2023-04-13 (×10): qty 2

## 2023-04-13 MED ORDER — HYDROMORPHONE HCL 1 MG/ML IJ SOLN
0.5000 mg | INTRAMUSCULAR | Status: DC | PRN
  Administered 2023-04-13 – 2023-04-14 (×3): 0.5 mg via INTRAVENOUS
  Filled 2023-04-13 (×3): qty 0.5

## 2023-04-13 MED ORDER — GADOBUTROL 1 MMOL/ML IV SOLN
8.0000 mL | Freq: Once | INTRAVENOUS | Status: AC | PRN
Start: 2023-04-13 — End: 2023-04-13
  Administered 2023-04-13: 8 mL via INTRAVENOUS

## 2023-04-13 MED ORDER — SODIUM CHLORIDE 0.9 % IV BOLUS
1000.0000 mL | Freq: Once | INTRAVENOUS | Status: AC
  Administered 2023-04-13: 1000 mL via INTRAVENOUS

## 2023-04-13 NOTE — Assessment & Plan Note (Signed)
Secondary to vomiting. Oral replacement of tolerated, IV if not

## 2023-04-13 NOTE — Consult Note (Signed)
PHARMACY - BRIEF ANTIBIOTIC NOTE   Pharmacy has received consult(s) for cefepime from an ED provider. The patient's profile has been reviewed for ht/wt/allergies/indication/available labs.    One time order(s) placed for: cefepime 2 g  Further antibiotics/pharmacy consults should be ordered by admitting physician if indicated.                       Thank you,  Will M. Dareen Piano, PharmD Clinical Pharmacist 04/13/2023 6:49 PM
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
## 2023-04-13 NOTE — Assessment & Plan Note (Signed)
Creatinine 1.14, up from baseline of 0.83 Likely a combination of prerenal from vomiting and obstructive ureteral stone IV hydration Monitor renal function and avoid nephrotoxins Urology consult as above

## 2023-04-13 NOTE — ED Triage Notes (Addendum)
Patient to ED via [facility] for chest pain and vomiting. Patient states 2 days ago she started vomiting and was not able to "hold anything down". Patient states she tries to eat and drink but it "comes back up". Patient is complaining of chest pain for the past 2 hours and lower abdominal pain. Patient also says she has a "7 mm kidney stone and a UTI". [facility] administered Zofran in route that did not stop patient's vomiting.

## 2023-04-13 NOTE — ED Notes (Signed)
This RN and Elana RN assisted patient to use bedpan. Patient urinated and had a small bowel movement. Patient cleaned up and paper pad was placed under patient. Patient repositioned in bed and given call light.

## 2023-04-13 NOTE — Sepsis Progress Note (Signed)
Elink monitoring for the code sepsis protocol.  

## 2023-04-13 NOTE — Assessment & Plan Note (Signed)
Not currently on related meds

## 2023-04-13 NOTE — ED Notes (Signed)
Pt left for CT.

## 2023-04-13 NOTE — ED Notes (Signed)
Rad at bedside.

## 2023-04-13 NOTE — Assessment & Plan Note (Signed)
History of cholecystectomy CT shows "Previous cholecystectomy with increasing biliary ductal dilatation down to the level of the ampulla. Further workup when clinically appropriate such as MRCP" MRCP ordered

## 2023-04-13 NOTE — ED Provider Notes (Signed)
[facility] Provider Note    None    (approximate)   History   Chest Pain   HPI  Kathy Walton is a 47 y.o. female with history of kidney stones pyelonephritis recent stent placement presents to the ER for evaluation of 3 days of chest pain and intractable nausea vomiting flank pain abdominal pain.  No history of liver disease.  She is complaining of epigastric and right upper quadrant abdominal pain as well as some dysuria.  States she has not been able to keep anything down for 48 hours.     Physical Exam   Triage Vital Signs: ED Triage Vitals [04/13/23 1744]  Encounter Vitals Group     BP      Systolic BP Percentile      Diastolic BP Percentile      Pulse      Resp      Temp      Temp src      SpO2 99 %     Weight      Height      Head Circumference      Peak Flow      Pain Score      Pain Loc      Pain Education      Exclude from Growth Chart     Most recent vital signs: Vitals:   04/13/23 2237 04/13/23 2237  BP:  (!) 145/82  Pulse:  82  Resp:  17  Temp:    SpO2: 98% 98%     Constitutional: Alert, ill-appearing. Eyes: Conjunctivae are normal.  Head: Atraumatic. Nose: No congestion/rhinnorhea. Mouth/Throat: Mucous membranes are moist.   Neck: Painless ROM.  Cardiovascular:   Good peripheral circulation. Respiratory: Normal respiratory effort.  No retractions.  Gastrointestinal: Soft a diffusely tender particularly in the right upper quadrant and bilateral flanks. Musculoskeletal:  no deformity Neurologic:  MAE spontaneously. No gross focal neurologic deficits are appreciated.  Skin:  Skin is warm, dry and intact. No rash noted. Psychiatric: Mood and affect are normal. Speech and behavior are normal.    ED Results / Procedures / Treatments   Labs (all labs ordered are listed, but only abnormal results are displayed) Labs Reviewed  CBC WITH DIFFERENTIAL/PLATELET - Abnormal; Notable for the following components:       Result Value   WBC 13.2 (*)    Hemoglobin 11.0 (*)    HCT 33.7 (*)    RDW 17.1 (*)    Platelets 481 (*)    Neutro Abs 11.4 (*)    All other components within normal limits  COMPREHENSIVE METABOLIC PANEL - Abnormal; Notable for the following components:   Sodium 134 (*)    Potassium 3.2 (*)    Glucose, Bld 162 (*)    Creatinine, Ser 1.14 (*)    Calcium 8.8 (*)    Total Protein 8.9 (*)    Albumin 3.1 (*)    AST 123 (*)    ALT 63 (*)    Alkaline Phosphatase 319 (*)    Total Bilirubin 3.7 (*)    All other components within normal limits  URINALYSIS, ROUTINE W REFLEX MICROSCOPIC - Abnormal; Notable for the following components:   Color, Urine YELLOW (*)    APPearance TURBID (*)    Hgb urine dipstick SMALL (*)    Protein, ur 100 (*)    Nitrite POSITIVE (*)    Leukocytes,Ua LARGE (*)    Bacteria, UA RARE (*)    All  other components within normal limits  CULTURE, BLOOD (SINGLE)  LACTIC ACID, PLASMA  LACTIC ACID, PLASMA  LIPASE, BLOOD  PREGNANCY, URINE  HIV ANTIBODY (ROUTINE TESTING W REFLEX)     EKG  ED ECG REPORT I, Willy Eddy, the attending physician, personally viewed and interpreted this ECG.   Date: 04/13/2023  EKG Time: 17:46  Rate: 90  Rhythm: sinus  Axis: normal  Intervals: normal  ST&T Change: no stmei, no depressions    RADIOLOGY Please see ED Course for my review and interpretation.  I personally reviewed all radiographic images ordered to evaluate for the above acute complaints and reviewed radiology reports and findings.  These findings were personally discussed with the patient.  Please see medical record for radiology report.    PROCEDURES:  Critical Care performed: No  Procedures   MEDICATIONS ORDERED IN ED: Medications  HYDROmorphone (DILAUDID) injection 0.5 mg (0.5 mg Intravenous Given 04/13/23 2109)  acetaminophen (TYLENOL) tablet 650 mg (has no administration in time range)    Or  acetaminophen (TYLENOL) suppository 650 mg  (has no administration in time range)  ondansetron (ZOFRAN) tablet 4 mg (has no administration in time range)    Or  ondansetron (ZOFRAN) injection 4 mg (has no administration in time range)  HYDROcodone-acetaminophen (NORCO/VICODIN) 5-325 MG per tablet 1-2 tablet (has no administration in time range)  sodium chloride 0.9 % bolus 1,000 mL (0 mLs Intravenous Stopped 04/13/23 2105)  ondansetron (ZOFRAN) injection 4 mg (4 mg Intravenous Given 04/13/23 1827)  ceFEPIme (MAXIPIME) 2 g in sodium chloride 0.9 % 100 mL IVPB (0 g Intravenous Stopped 04/13/23 2021)  iohexol (OMNIPAQUE) 300 MG/ML solution 100 mL (100 mLs Intravenous Contrast Given 04/13/23 1929)  metoCLOPramide (REGLAN) injection 10 mg (10 mg Intravenous Given 04/13/23 1944)  gadobutrol (GADAVIST) 1 MMOL/ML injection 8 mL (8 mLs Intravenous Contrast Given 04/13/23 2226)     IMPRESSION / MDM / ASSESSMENT AND PLAN / ED COURSE  I reviewed the triage vital signs and the nursing notes.                              Differential diagnosis includes, but is not limited to, sepsis, stone, pyelonephritis, pancreatitis, biliary pathology, SBO, withdrawal  Patient presenting to the ER for evaluation of symptoms as described above.  Based on symptoms, risk factors and considered above differential, this presenting complaint could reflect a potentially life-threatening illness therefore the patient will be placed on continuous pulse oximetry and telemetry for monitoring.  Laboratory evaluation will be sent to evaluate for the above complaints.      Clinical Course as of 04/13/23 2244  Thu Apr 13, 2023  1842 Patient with UTI.  Will cover with IV antibiotics while awaiting remainder of workup. [PR]  1928 Lactate normal.  T. bili is elevated elevated alk phos.  CT imaging pending.  Chest x-ray on my review and interpretation without evidence of consolidation. [PR]  2104 Case discussed in consultation with urology who recommends admission to hospital  for IV antibiotics and medical management.  Hospitalist consulted for admission. [PR]    Clinical Course User Index [PR] Willy Eddy, MD     FINAL CLINICAL IMPRESSION(S) / ED DIAGNOSES   Final diagnoses:  Acute pyelonephritis     Rx / DC Orders   ED Discharge Orders     None        Note:  This document was prepared using Dragon voice recognition software and may  include unintentional dictation errors.    Willy Eddy, MD 04/13/23 [phone]

## 2023-04-13 NOTE — Assessment & Plan Note (Addendum)
Retained encrusted/calcified right ureteral stent Right pelvic ureter stone/hx of bladder calculus History of prior right nephrostomy Continue cefepime Pain control N.p.o. from midnight Formal urology consult placed

## 2023-04-13 NOTE — ED Notes (Signed)
Pt left to MRI

## 2023-04-13 NOTE — Assessment & Plan Note (Signed)
Prior history of cocaine and marijuana use Monitor for withdrawal

## 2023-04-13 NOTE — Progress Notes (Signed)
Pharmacy Antibiotic Note  Kathy Walton is a 47 y.o. female admitted on 04/13/2023 with UTI.  Pharmacy has been consulted for Cefepime dosing.  Plan: Cefepime 2 gm IV X 1 given in ED on 10/24 @ 1942. Cefepime 2 gm IV Q12H ordered to start on 10/25 @ 0800.   Height: 5\' 8"  (172.7 cm) Weight: 81.6 kg (180 lb) IBW/kg (Calculated) : 63.9  Temp (24hrs), Avg:98.1 F (36.7 C), Min:98 F (36.7 C), Max:98.2 F (36.8 C)  Recent Labs  Lab 04/13/23 1757 04/13/23 2108  WBC 13.2*  --   CREATININE 1.14*  --   LATICACIDVEN 1.0 0.9    Estimated Creatinine Clearance: 69.1 mL/min (A) (by C-G formula based on SCr of 1.14 mg/dL (H)).    Allergies  Allergen Reactions   Cephalexin Rash    Antimicrobials this admission:   >>    >>   Dose adjustments this admission:   Microbiology results:  BCx:   UCx:    Sputum:    MRSA PCR:   Thank you for allowing pharmacy to be a part of this patient's care.  Romona Murdy D 04/13/2023 11:12 PM

## 2023-04-13 NOTE — ED Notes (Signed)
Pt returned from CT °

## 2023-04-13 NOTE — Assessment & Plan Note (Signed)
Not currently on meds.   °

## 2023-04-13 NOTE — Consult Note (Signed)
CODE SEPSIS - PHARMACY COMMUNICATION  **Broad-spectrum antimicrobials should be administered within one hour of sepsis diagnosis**  Time Code Sepsis call or page was received: 1841  Antibiotics ordered: Cefepime  Time of first antibiotic administration: 1942  Additional action taken by pharmacy: N/A Patient left for CT  If necessary, name of provider/nurse contacted: N/A    Will M. Dareen Piano, PharmD Clinical Pharmacist 04/13/2023 6:49 PM

## 2023-04-14 ENCOUNTER — Inpatient Hospital Stay: Payer: Medicaid Other | Admitting: Anesthesiology

## 2023-04-14 ENCOUNTER — Other Ambulatory Visit: Payer: Self-pay | Admitting: Urology

## 2023-04-14 ENCOUNTER — Inpatient Hospital Stay: Payer: Medicaid Other

## 2023-04-14 ENCOUNTER — Encounter
Admission: EM | Disposition: A | Discharge status: Home / Self Care | Payer: Self-pay | Source: Home / Self Care | Attending: Internal Medicine

## 2023-04-14 ENCOUNTER — Encounter: Payer: Self-pay | Admitting: Internal Medicine

## 2023-04-14 DIAGNOSIS — K8050 Calculus of bile duct without cholangitis or cholecystitis without obstruction: Secondary | ICD-10-CM | POA: Diagnosis not present

## 2023-04-14 DIAGNOSIS — A4151 Sepsis due to Escherichia coli [E. coli]: Secondary | ICD-10-CM | POA: Diagnosis not present

## 2023-04-14 DIAGNOSIS — R7989 Other specified abnormal findings of blood chemistry: Secondary | ICD-10-CM | POA: Diagnosis not present

## 2023-04-14 DIAGNOSIS — N1330 Unspecified hydronephrosis: Secondary | ICD-10-CM

## 2023-04-14 DIAGNOSIS — R6520 Severe sepsis without septic shock: Secondary | ICD-10-CM | POA: Diagnosis not present

## 2023-04-14 DIAGNOSIS — R109 Unspecified abdominal pain: Secondary | ICD-10-CM

## 2023-04-14 DIAGNOSIS — N179 Acute kidney failure, unspecified: Secondary | ICD-10-CM | POA: Diagnosis not present

## 2023-04-14 DIAGNOSIS — N210 Calculus in bladder: Secondary | ICD-10-CM

## 2023-04-14 DIAGNOSIS — B9620 Unspecified Escherichia coli [E. coli] as the cause of diseases classified elsewhere: Secondary | ICD-10-CM | POA: Diagnosis present

## 2023-04-14 DIAGNOSIS — A419 Sepsis, unspecified organism: Secondary | ICD-10-CM | POA: Diagnosis present

## 2023-04-14 DIAGNOSIS — Z960 Presence of urogenital implants: Secondary | ICD-10-CM | POA: Diagnosis not present

## 2023-04-14 DIAGNOSIS — N201 Calculus of ureter: Secondary | ICD-10-CM

## 2023-04-14 DIAGNOSIS — R112 Nausea with vomiting, unspecified: Secondary | ICD-10-CM

## 2023-04-14 HISTORY — PX: REMOVAL OF STONES: SHX5545

## 2023-04-14 HISTORY — PX: ENDOSCOPIC RETROGRADE CHOLANGIOPANCREATOGRAPHY (ERCP) WITH PROPOFOL: SHX5810

## 2023-04-14 HISTORY — PX: SPHINCTEROTOMY: SHX5544

## 2023-04-14 LAB — COMPREHENSIVE METABOLIC PANEL
ALT: 76 U/L — ABNORMAL HIGH (ref 0–44)
AST: 130 U/L — ABNORMAL HIGH (ref 15–41)
Albumin: 2.9 g/dL — ABNORMAL LOW (ref 3.5–5.0)
Alkaline Phosphatase: 307 U/L — ABNORMAL HIGH (ref 38–126)
Anion gap: 11 (ref 5–15)
BUN: 9 mg/dL (ref 6–20)
CO2: 27 mmol/L (ref 22–32)
Calcium: 8.5 mg/dL — ABNORMAL LOW (ref 8.9–10.3)
Chloride: 98 mmol/L (ref 98–111)
Creatinine, Ser: 1.24 mg/dL — ABNORMAL HIGH (ref 0.44–1.00)
GFR, Estimated: 54 mL/min — ABNORMAL LOW (ref >60)
Glucose, Bld: 111 mg/dL — ABNORMAL HIGH (ref 70–99)
Potassium: 3.5 mmol/L (ref 3.5–5.1)
Sodium: 136 mmol/L (ref 135–145)
Total Bilirubin: 4.5 mg/dL — ABNORMAL HIGH (ref 0.3–1.2)
Total Protein: 8.5 g/dL — ABNORMAL HIGH (ref 6.5–8.1)

## 2023-04-14 LAB — CBC WITH DIFFERENTIAL/PLATELET
Abs Immature Granulocytes: 0.06 10*3/uL (ref 0.00–0.07)
Basophils Absolute: 0.1 10*3/uL (ref 0.0–0.1)
Basophils Relative: 0 %
Eosinophils Absolute: 0.1 10*3/uL (ref 0.0–0.5)
Eosinophils Relative: 1 %
HCT: 33.3 % — ABNORMAL LOW (ref 36.0–46.0)
Hemoglobin: 10.6 g/dL — ABNORMAL LOW (ref 12.0–15.0)
Immature Granulocytes: 1 %
Lymphocytes Relative: 16 %
Lymphs Abs: 2 10*3/uL (ref 0.7–4.0)
MCH: 26.9 pg (ref 26.0–34.0)
MCHC: 31.8 g/dL (ref 30.0–36.0)
MCV: 84.5 fL (ref 80.0–100.0)
Monocytes Absolute: 0.7 10*3/uL (ref 0.1–1.0)
Monocytes Relative: 6 %
Neutro Abs: 9.3 10*3/uL — ABNORMAL HIGH (ref 1.7–7.7)
Neutrophils Relative %: 76 %
Platelets: 481 10*3/uL — ABNORMAL HIGH (ref 150–400)
RBC: 3.94 MIL/uL (ref 3.87–5.11)
RDW: 17.2 % — ABNORMAL HIGH (ref 11.5–15.5)
WBC: 12.2 10*3/uL — ABNORMAL HIGH (ref 4.0–10.5)
nRBC: 0 % (ref 0.0–0.2)

## 2023-04-14 LAB — BLOOD CULTURE ID PANEL (REFLEXED) - BCID2

## 2023-04-14 LAB — PHOSPHORUS: Phosphorus: 3.4 mg/dL (ref 2.5–4.6)

## 2023-04-14 LAB — MAGNESIUM: Magnesium: 2.1 mg/dL (ref 1.7–2.4)

## 2023-04-14 LAB — HIV ANTIBODY (ROUTINE TESTING W REFLEX): HIV Screen 4th Generation wRfx: NONREACTIVE

## 2023-04-14 SURGERY — ENDOSCOPIC RETROGRADE CHOLANGIOPANCREATOGRAPHY (ERCP) WITH PROPOFOL
Anesthesia: General

## 2023-04-14 MED ORDER — PROPOFOL 10 MG/ML IV BOLUS
INTRAVENOUS | Status: DC | PRN
  Administered 2023-04-14: 50 mg via INTRAVENOUS

## 2023-04-14 MED ORDER — HYDROMORPHONE HCL 1 MG/ML IJ SOLN
1.0000 mg | INTRAMUSCULAR | Status: DC | PRN
  Administered 2023-04-14 – 2023-04-15 (×11): 1 mg via INTRAVENOUS
  Filled 2023-04-14 (×12): qty 1

## 2023-04-14 MED ORDER — DEXMEDETOMIDINE HCL IN NACL 80 MCG/20ML IV SOLN
INTRAVENOUS | Status: AC
Start: 2023-04-14 — End: ?
  Filled 2023-04-14: qty 40

## 2023-04-14 MED ORDER — DICLOFENAC SUPPOSITORY 100 MG
RECTAL | Status: AC
  Filled 2023-04-14: qty 1

## 2023-04-14 MED ORDER — MIDAZOLAM HCL 2 MG/2ML IJ SOLN
INTRAMUSCULAR | Status: DC | PRN
Start: 2023-04-14 — End: 2023-04-14
  Administered 2023-04-14: 2 mg via INTRAVENOUS

## 2023-04-14 MED ORDER — PROPOFOL 500 MG/50ML IV EMUL
INTRAVENOUS | Status: DC | PRN
  Administered 2023-04-14: 150 ug/kg/min via INTRAVENOUS

## 2023-04-14 MED ORDER — SODIUM CHLORIDE 0.9 % IV SOLN
1.0000 g | Freq: Three times a day (TID) | INTRAVENOUS | Status: AC
  Administered 2023-04-14 – 2023-04-19 (×16): 1 g via INTRAVENOUS
  Filled 2023-04-14 (×16): qty 20

## 2023-04-14 MED ORDER — DICLOFENAC SUPPOSITORY 100 MG
100.0000 mg | Freq: Once | RECTAL | Status: AC
  Administered 2023-04-14: 100 mg via RECTAL

## 2023-04-14 MED ORDER — LACTATED RINGERS IV SOLN: INTRAVENOUS | Status: AC

## 2023-04-14 MED ORDER — LACTATED RINGERS IV SOLN: INTRAVENOUS | Status: DC

## 2023-04-14 MED ORDER — DEXMEDETOMIDINE HCL IN NACL 80 MCG/20ML IV SOLN
INTRAVENOUS | Status: DC | PRN
  Administered 2023-04-14: 8 ug via INTRAVENOUS

## 2023-04-14 MED ORDER — MIDAZOLAM HCL 2 MG/2ML IJ SOLN
INTRAMUSCULAR | Status: AC
  Filled 2023-04-14: qty 2

## 2023-04-14 MED ORDER — LIDOCAINE HCL (CARDIAC) PF 100 MG/5ML IV SOSY
PREFILLED_SYRINGE | INTRAVENOUS | Status: DC | PRN
  Administered 2023-04-14: 100 mg via INTRAVENOUS

## 2023-04-14 NOTE — TOC CM/SW Note (Signed)
TOC consulted for substance use. SA resources added to AVS. Please re consult TOC if additional needs arise.  Alfonso Ramus, LCSW Transitions of Care Department [phone]
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
## 2023-04-14 NOTE — Transfer of Care (Signed)
Immediate Anesthesia Transfer of Care Note  Patient: Kathy Walton  Procedure(s) Performed: ENDOSCOPIC RETROGRADE CHOLANGIOPANCREATOGRAPHY (ERCP) WITH PROPOFOL SPHINCTEROTOMY Balloon dilation wire-guided  Patient Location: PACU and Endoscopy Unit  Anesthesia Type:General  Level of Consciousness: awake  Airway & Oxygen Therapy: Patient Spontanous Breathing  Post-op Assessment: Report given to RN and Post -op Vital signs reviewed and stable  Post vital signs: Reviewed and stable  Last Vitals:  Vitals Value Taken Time  BP    Temp    Pulse 75 04/14/23 1305  Resp    SpO2 98 % 04/14/23 1305  Vitals shown include unfiled device data.  Last Pain:  Vitals:   04/14/23 1211  TempSrc: Temporal  PainSc:          Complications: No notable events documented.

## 2023-04-14 NOTE — ED Notes (Signed)
MD Para March came to see patient and informed this RN patient could have something to eat and drink and agreed a sandwich tray would be alright since the patient previously was unable to keep anything down and was vomiting. Patient given tray and something to drink. MD stated would be making patient NPO at 0130.
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
## 2023-04-14 NOTE — Anesthesia Procedure Notes (Signed)
Procedure Name: MAC Date/Time: 04/14/2023 12:37 PM  Performed by: Cheral Bay, CRNAPre-anesthesia Checklist: Patient identified, Emergency Drugs available, Suction available, Patient being monitored and Timeout performed Patient Re-evaluated:Patient Re-evaluated prior to induction Oxygen Delivery Method: Nasal cannula Induction Type: IV induction Placement Confirmation: positive ETCO2 and CO2 detector

## 2023-04-14 NOTE — ED Notes (Signed)
Report given to Anna, RN 

## 2023-04-14 NOTE — ED Notes (Signed)
Pt stating pain is the worst she has felt and that the dilaudid did not work for her.

## 2023-04-14 NOTE — Plan of Care (Signed)
IR consulted to evaluate for bilateral hydronephrosis. Patient refusing bilateral nephrostomy tube placement at this time. IR will continue to remain available should patient decide to proceed with PCN placement.  Kennieth Francois, PA-C 04/14/2023 8:29 AM

## 2023-04-14 NOTE — Anesthesia Postprocedure Evaluation (Signed)
Anesthesia Post Note  Patient: Kathy Walton  Procedure(s) Performed: ENDOSCOPIC RETROGRADE CHOLANGIOPANCREATOGRAPHY (ERCP) WITH PROPOFOL SPHINCTEROTOMY Balloon dilation wire-guided  Patient location during evaluation: PACU Anesthesia Type: General Level of consciousness: awake Pain management: satisfactory to patient Vital Signs Assessment: post-procedure vital signs reviewed and stable Respiratory status: spontaneous breathing Cardiovascular status: stable Anesthetic complications: no   No notable events documented.   Last Vitals:  Vitals:   04/14/23 1305 04/14/23 1315  BP: 99/62 (!) 90/59  Pulse: 75 71  Resp: 14 14  Temp:  (!) 36.1 C  SpO2: 98% 96%    Last Pain:  Vitals:   04/14/23 1315  TempSrc: Temporal  PainSc: 0-No pain                 VAN STAVEREN,Ronzell Laban

## 2023-04-14 NOTE — Op Note (Signed)
[facility] Gastroenterology Patient Name: Kathy Walton Procedure Date: 04/14/2023 11:54 AM MRN: [id number] Account #: [id number] Date of Birth: [birth year] Admit Type: Inpatient Age: 47 Room: [facility] ENDO ROOM 4 Gender: Female Note Status: Finalized Instrument Name: Nolon Stalls [id number] Procedure:             ERCP Indications:           Common bile duct stone(s) Providers:             Midge Minium MD, MD Medicines:             Propofol per Anesthesia Complications:         No immediate complications. Procedure:             Pre-Anesthesia Assessment:                        - Prior to the procedure, a History and Physical was                         performed, and patient medications and allergies were                         reviewed. The patient's tolerance of previous                         anesthesia was also reviewed. The risks and benefits                         of the procedure and the sedation options and risks                         were discussed with the patient. All questions were                         answered, and informed consent was obtained. Prior                         Anticoagulants: The patient has taken no anticoagulant                         or antiplatelet agents. ASA Grade Assessment: II - A                         patient with mild systemic disease. After reviewing                         the risks and benefits, the patient was deemed in                         satisfactory condition to undergo the procedure.                        After obtaining informed consent, the scope was passed                         under direct vision. Throughout the procedure, the                         patient's blood pressure,  pulse, and oxygen                         saturations were monitored continuously. The                         Duodenoscope was introduced through the mouth, and                         used to inject contrast into and used to inject                          contrast into the bile duct. The ERCP was accomplished                         without difficulty. The patient tolerated the                         procedure well. Findings:      A scout film of the abdomen was obtained. Surgical clips, consistent       with a previous cholecystectomy, were seen in the area of the right       upper quadrant of the abdomen. The esophagus was successfully intubated       under direct vision. The scope was advanced to a normal major papilla in       the descending duodenum without detailed examination of the pharynx,       larynx and associated structures, and upper GI tract. The upper GI tract       was grossly normal. The bile duct was deeply cannulated with the       short-nosed traction sphincterotome. Contrast was injected. I personally       interpreted the bile duct images. There was brisk flow of contrast       through the ducts. Image quality was excellent. Contrast extended to the       entire biliary tree. The middle third of the main bile duct contained       one stone. A wire was passed into the biliary tree. A 7 mm biliary       sphincterotomy was made with a traction (standard) sphincterotome using       ERBE electrocautery. There was no post-sphincterotomy bleeding. The       biliary tree was swept with a 15 mm balloon starting at the bifurcation.       One stone was removed. No stones remained. Impression:            - Choledocholithiasis was found. Complete removal was                         accomplished by biliary sphincterotomy and balloon                         extraction.                        - A biliary sphincterotomy was performed.                        - The biliary tree was swept. Recommendation:        - Return patient to hospital ward for  ongoing care.                        - Clear liquid diet today.                        - Watch for pancreatitis, bleeding, perforation, and                          cholangitis. Procedure Code(s):     --- Professional ---                        [phone], Endoscopic retrograde cholangiopancreatography                         (ERCP); with removal of calculi/debris from                         biliary/pancreatic duct(s)                        43262, Endoscopic retrograde cholangiopancreatography                         (ERCP); with sphincterotomy/papillotomy                        [phone], Endoscopic catheterization of the biliary                         ductal system, radiological supervision and                         interpretation Diagnosis Code(s):     --- Professional ---                        K80.50, Calculus of bile duct without cholangitis or                         cholecystitis without obstruction CPT copyright 2022 [employer]. All rights reserved. The codes documented in this report are preliminary and upon coder review may  be revised to meet current compliance requirements. Midge Minium MD, MD 04/14/2023 1:01:31 PM This report has been signed electronically. Number of Addenda: 0 Note Initiated On: 04/14/2023 11:54 AM Estimated Blood Loss:  Estimated blood loss: none.      [facility]

## 2023-04-15 DIAGNOSIS — K8050 Calculus of bile duct without cholangitis or cholecystitis without obstruction: Secondary | ICD-10-CM | POA: Diagnosis not present

## 2023-04-15 DIAGNOSIS — B9620 Unspecified Escherichia coli [E. coli] as the cause of diseases classified elsewhere: Secondary | ICD-10-CM

## 2023-04-15 DIAGNOSIS — Z9889 Other specified postprocedural states: Secondary | ICD-10-CM

## 2023-04-15 DIAGNOSIS — R7881 Bacteremia: Secondary | ICD-10-CM | POA: Diagnosis not present

## 2023-04-15 LAB — CBC WITH DIFFERENTIAL/PLATELET
Abs Immature Granulocytes: 0.03 10*3/uL (ref 0.00–0.07)
Basophils Absolute: 0 10*3/uL (ref 0.0–0.1)
Basophils Relative: 1 %
Eosinophils Absolute: 0.3 10*3/uL (ref 0.0–0.5)
Eosinophils Relative: 5 %
HCT: 30.3 % — ABNORMAL LOW (ref 36.0–46.0)
Hemoglobin: 9.6 g/dL — ABNORMAL LOW (ref 12.0–15.0)
Immature Granulocytes: 0 %
Lymphocytes Relative: 25 %
Lymphs Abs: 1.9 10*3/uL (ref 0.7–4.0)
MCH: 27.2 pg (ref 26.0–34.0)
MCHC: 31.7 g/dL (ref 30.0–36.0)
MCV: 85.8 fL (ref 80.0–100.0)
Monocytes Absolute: 0.4 10*3/uL (ref 0.1–1.0)
Monocytes Relative: 6 %
Neutro Abs: 4.8 10*3/uL (ref 1.7–7.7)
Neutrophils Relative %: 63 %
Platelets: 357 10*3/uL (ref 150–400)
RBC: 3.53 MIL/uL — ABNORMAL LOW (ref 3.87–5.11)
RDW: 17.2 % — ABNORMAL HIGH (ref 11.5–15.5)
WBC: 7.6 10*3/uL (ref 4.0–10.5)
nRBC: 0 % (ref 0.0–0.2)

## 2023-04-15 LAB — COMPREHENSIVE METABOLIC PANEL
ALT: 59 U/L — ABNORMAL HIGH (ref 0–44)
AST: 86 U/L — ABNORMAL HIGH (ref 15–41)
Albumin: 2.6 g/dL — ABNORMAL LOW (ref 3.5–5.0)
Alkaline Phosphatase: 247 U/L — ABNORMAL HIGH (ref 38–126)
Anion gap: 8 (ref 5–15)
BUN: 10 mg/dL (ref 6–20)
CO2: 27 mmol/L (ref 22–32)
Calcium: 8.3 mg/dL — ABNORMAL LOW (ref 8.9–10.3)
Chloride: 101 mmol/L (ref 98–111)
Creatinine, Ser: 1.22 mg/dL — ABNORMAL HIGH (ref 0.44–1.00)
GFR, Estimated: 55 mL/min — ABNORMAL LOW (ref >60)
Glucose, Bld: 88 mg/dL (ref 70–99)
Potassium: 3.8 mmol/L (ref 3.5–5.1)
Sodium: 136 mmol/L (ref 135–145)
Total Bilirubin: 1.5 mg/dL — ABNORMAL HIGH (ref 0.3–1.2)
Total Protein: 7.5 g/dL (ref 6.5–8.1)

## 2023-04-15 MED ORDER — LACTATED RINGERS IV SOLN: INTRAVENOUS | Status: DC

## 2023-04-15 MED ORDER — HYDROXYZINE HCL 10 MG PO TABS
10.0000 mg | ORAL_TABLET | Freq: Once | ORAL | Status: AC
  Administered 2023-04-15: 10 mg via ORAL
  Filled 2023-04-15: qty 1

## 2023-04-15 NOTE — Progress Notes (Signed)
Kathy Minium, MD [facility]   [address] [place], [place] [address] Phone: [phone] Fax : [phone]   Subjective: The patient is laying in bed comfortably with her eyes closed and answers all of her questions with her eyes staying close.  She denies any further abdominal pain nausea vomiting fevers or chills.  The patient's liver enzymes are improving and she had an ERCP with a stone extraction without any signs of post ERCP pancreatitis since the procedure.   Objective: Vital signs in last 24 hours: Vitals:   04/14/23 2242 04/15/23 0339 04/15/23 0845 04/15/23 1201  BP: (!) 90/58 126/89 (!) 103/56 93/65  Pulse: 67 76 63 (!) 59  Resp: 18 18 (!) 2 (!) 21  Temp: 97.7 F (36.5 C) 97.9 F (36.6 C) 97.9 F (36.6 C) 98.2 F (36.8 C)  TempSrc: Oral Oral Oral Oral  SpO2: 97% 100% 96% 97%  Weight:      Height:       Weight change:   Intake/Output Summary (Last 24 hours) at 04/15/2023 1339 Last data filed at 04/15/2023 0800 Gross per 24 hour  Intake 734.15 ml  Output 650 ml  Net 84.15 ml     Exam: General: Patient in no apparent distress.  Lab Results: @LABTEST2 @ Micro Results: Recent Results (from the past 240 hour(s))  Culture, blood (single)     Status: Abnormal (Preliminary result)   Collection Time: 04/13/23  7:05 PM   Specimen: BLOOD  Result Value Ref Range Status   Specimen Description   Final    BLOOD BLOOD RIGHT HAND Performed at [facility], [address] [place], [place] [address]    Special Requests   Final    BOTTLES DRAWN AEROBIC AND ANAEROBIC Blood Culture adequate volume Performed at [facility], [address] [place], [place] [address]    Culture  Setup Time   Final    GRAM NEGATIVE RODS ANAEROBIC BOTTLE ONLY Organism ID to follow CRITICAL RESULT CALLED TO, READ BACK BY AND VERIFIED WITH: NATALIE REIFFER @0744  04/14/23 MJU Performed at [facility], [address] [place], [place] [address]     Culture (A)  Final    ESCHERICHIA COLI SUSCEPTIBILITIES TO FOLLOW Performed at [facility], [address], [place], [place] [address]    Report Status PENDING  Incomplete  Blood Culture ID Panel (Reflexed)     Status: Abnormal   Collection Time: 04/13/23  7:05 PM  Result Value Ref Range Status   Enterococcus faecalis NOT DETECTED NOT DETECTED Final   Enterococcus Faecium NOT DETECTED NOT DETECTED Final   Listeria monocytogenes NOT DETECTED NOT DETECTED Final   Staphylococcus species NOT DETECTED NOT DETECTED Final   Staphylococcus aureus (BCID) NOT DETECTED NOT DETECTED Final   Staphylococcus epidermidis NOT DETECTED NOT DETECTED Final   Staphylococcus lugdunensis NOT DETECTED NOT DETECTED Final   Streptococcus species NOT DETECTED NOT DETECTED Final   Streptococcus agalactiae NOT DETECTED NOT DETECTED Final   Streptococcus pneumoniae NOT DETECTED NOT DETECTED Final   Streptococcus pyogenes NOT DETECTED NOT DETECTED Final   A.calcoaceticus-baumannii NOT DETECTED NOT DETECTED Final   Bacteroides fragilis NOT DETECTED NOT DETECTED Final   Enterobacterales DETECTED (A) NOT DETECTED Final    Comment: Enterobacterales represent a large order of gram negative bacteria, not a single organism. CRITICAL RESULT CALLED TO, READ BACK BY AND VERIFIED WITH: NATALIE REIFFER @0744  04/14/23 MJU    Enterobacter cloacae complex NOT DETECTED NOT DETECTED Final  Escherichia coli DETECTED (A) NOT DETECTED Final    Comment: CRITICAL RESULT CALLED TO, READ BACK BY AND VERIFIED WITH: NATALIE REIFFER @0744  04/14/23 MJU    Klebsiella aerogenes NOT DETECTED NOT DETECTED Final   Klebsiella oxytoca NOT DETECTED NOT DETECTED Final   Klebsiella pneumoniae NOT DETECTED NOT DETECTED Final   Proteus species NOT DETECTED NOT DETECTED Final   Salmonella species NOT DETECTED NOT DETECTED Final   Serratia marcescens NOT DETECTED NOT DETECTED Final   Haemophilus influenzae NOT DETECTED NOT DETECTED Final    Neisseria meningitidis NOT DETECTED NOT DETECTED Final   Pseudomonas aeruginosa NOT DETECTED NOT DETECTED Final   Stenotrophomonas maltophilia NOT DETECTED NOT DETECTED Final   Candida albicans NOT DETECTED NOT DETECTED Final   Candida auris NOT DETECTED NOT DETECTED Final   Candida glabrata NOT DETECTED NOT DETECTED Final   Candida krusei NOT DETECTED NOT DETECTED Final   Candida parapsilosis NOT DETECTED NOT DETECTED Final   Candida tropicalis NOT DETECTED NOT DETECTED Final   Cryptococcus neoformans/gattii NOT DETECTED NOT DETECTED Final   CTX-M ESBL DETECTED (A) NOT DETECTED Final    Comment: CRITICAL RESULT CALLED TO, READ BACK BY AND VERIFIED WITH: NATALIE REIFFER @0744  04/14/23 MJU (NOTE) Extended spectrum beta-lactamase detected. Recommend a carbapenem as initial therapy.      Carbapenem resistance IMP NOT DETECTED NOT DETECTED Final   Carbapenem resistance KPC NOT DETECTED NOT DETECTED Final   Carbapenem resistance NDM NOT DETECTED NOT DETECTED Final   Carbapenem resist OXA 48 LIKE NOT DETECTED NOT DETECTED Final   Carbapenem resistance VIM NOT DETECTED NOT DETECTED Final    Comment: Performed at [facility], [address] [place], [place] [address]   Studies/Results: DG C-Arm 1-60 Min-No Report  Result Date: 04/14/2023 Fluoroscopy was utilized by the requesting physician.  No radiographic interpretation.   MR 3D Recon At Scanner  Result Date: 04/14/2023 : CLINICAL DATA: Elevated LFTs and right upper quadrant pain EXAM: MRI ABDOMEN WITHOUT AND WITH CONTRAST (INCLUDING MRCP) TECHNIQUE: Multiplanar multisequence MR imaging of the abdomen was performed both before and after the administration of intravenous contrast. Heavily T2-weighted images of the biliary and pancreatic ducts were obtained, and three-dimensional MRCP images were rendered by post processing. CONTRAST: 8mL GADAVIST GADOBUTROL 1 MMOL/ML IV SOLN COMPARISON: None Available. FINDINGS: Lower chest:  No acute findings. Hepatobiliary: No enhancing liver lesions. Cholecystectomy. Common bile duct and intrahepatic bile duct dilatation identified. CBD measures 1.4 cm in maximum diameter. Filling defect within the distal common bile duct just before the ampulla is identified concerning for choledocholithiasis. This measures 7 mm in maximum diameter, image 27/4. Pancreas: No mass, inflammatory changes, or other parenchymal abnormality identified. Spleen: Spleen measures 13.4 cm in cranial caudal dimension. No focal abnormality. Adrenals/Urinary Tract: Normal adrenal glands. Severe right-sided hydronephrosis. Right hydroureter identified. As noted on the previous exam there is a ureteral stent with distal migration with proximal pigtail in the mid ureter. Persistent moderate left hydronephrosis and hydroureter. Stomach/Bowel: Visualized portions within the abdomen are unremarkable. Vascular/Lymphatic: Normal caliber of the abdominal aorta. Patent upper abdominal vascularity. Retroperitoneal adenopathy is again noted. Aortocaval lymph node measures 1 cm short axis, image 54/25. Periaortic node measures 1.1 cm, image 61/25. Other: None. Musculoskeletal: No suspicious bone lesions identified. IMPRESSION: 1. Common bile duct and intrahepatic bile duct dilatation identified. Filling defect within the distal common bile duct just before the ampulla is identified concerning for choledocholithiasis. This measures 7 mm in maximum diameter. 2. Severe right-sided hydronephrosis and hydroureter. As  noted on the previous exam there is a ureteral stent with distal migration with proximal pigtail in the mid ureter. 3. Persistent moderate left hydronephrosis and hydroureter. 4. Retroperitoneal adenopathy is again noted. Etiology is indeterminate and potentially reactive. In the absence of a known malignancy recommend follow-up imaging with repeat CT of the abdomen in 3 months. 5. Mild splenomegaly. Electronically Signed   By: Signa Kell M.D.   On: 04/14/2023 10:55   MR ABDOMEN MRCP W WO CONTAST  Result Date: 04/14/2023 CLINICAL DATA:  Elevated LFTs and right upper quadrant pain EXAM: MRI ABDOMEN WITHOUT AND WITH CONTRAST (INCLUDING MRCP) TECHNIQUE: Multiplanar multisequence MR imaging of the abdomen was performed both before and after the administration of intravenous contrast. Heavily T2-weighted images of the biliary and pancreatic ducts were obtained, and three-dimensional MRCP images were rendered by post processing. CONTRAST:  8mL GADAVIST GADOBUTROL 1 MMOL/ML IV SOLN COMPARISON:  None Available. FINDINGS: Lower chest: No acute findings. Hepatobiliary: No enhancing liver lesions. Cholecystectomy. Common bile duct and intrahepatic bile duct dilatation identified. CBD measures 1.4 cm in maximum diameter. Filling defect within the distal common bile duct just before the ampulla is identified concerning for choledocholithiasis. This measures 7 mm in maximum diameter, image 27/4. Pancreas: No mass, inflammatory changes, or other parenchymal abnormality identified. Spleen: Spleen measures 13.4 cm in cranial caudal dimension. No focal abnormality. Adrenals/Urinary Tract: Normal adrenal glands. Severe right-sided hydronephrosis. Right hydroureter identified. As noted on the previous exam there is a ureteral stent with distal migration with proximal pigtail in the mid ureter. Persistent moderate left hydronephrosis and hydroureter. Stomach/Bowel: Visualized portions within the abdomen are unremarkable. Vascular/Lymphatic: Normal caliber of the abdominal aorta. Patent upper abdominal vascularity. Retroperitoneal adenopathy is again noted. Aortocaval lymph node measures 1 cm short axis, image 54/25. Periaortic node measures 1.1 cm, image 61/25. Other:  None. Musculoskeletal: No suspicious bone lesions identified. IMPRESSION: 1. Common bile duct and intrahepatic bile duct dilatation identified. Filling defect within the distal common bile duct  just before the ampulla is identified concerning for choledocholithiasis. This measures 7 mm in maximum diameter. 2. Severe right-sided hydronephrosis and hydroureter. As noted on the previous exam there is a ureteral stent with distal migration with proximal pigtail in the mid ureter. 3. Persistent moderate left hydronephrosis and hydroureter. 4. Retroperitoneal adenopathy is again noted. Etiology is indeterminate and potentially reactive. In the absence of a known malignancy recommend follow-up imaging with repeat CT of the abdomen in 3 months. 5. Mild splenomegaly. Electronically Signed   By: Signa Kell M.D.   On: 04/14/2023 04:56   CT ABDOMEN PELVIS W CONTRAST  Result Date: 04/13/2023 CLINICAL DATA:  Chest and abdominal pain. Vomiting. History of known stone disease and previous stent placement with complication. EXAM: CT ABDOMEN AND PELVIS WITH CONTRAST TECHNIQUE: Multidetector CT imaging of the abdomen and pelvis was performed using the standard protocol following bolus administration of intravenous contrast. RADIATION DOSE REDUCTION: This exam was performed according to the departmental dose-optimization program which includes automated exposure control, adjustment of the mA and/or kV according to patient size and/or use of iterative reconstruction technique. CONTRAST:  OMNIPAQUE IOHEXOL 300 MG/ML  SOLN COMPARISON:  CT 11/04/2022 without contrast and older. FINDINGS: Lower chest: Slight dependent ground-glass along bases with some reticular changes and septal thickening. No pleural effusion. Hepatobiliary: Previous cholecystectomy. There is intra hepatic biliary duct ectasia and ectasia of the extrahepatic common duct measuring up to 17 mm in diameter. The common duct does taper towards the pancreatic  head however to some extent. This has increased from previous. Would recommend further workup when appropriate such as MRCP. Patent portal vein. No space-occupying liver lesion. Pancreas:  Unremarkable. No pancreatic ductal dilatation or surrounding inflammatory changes. Spleen: Normal in size without focal abnormality. Adrenals/Urinary Tract: Adrenal glands are preserved. There is moderate parenchymal atrophy of the right kidney with severe ductal dilatation. The intrarenal and proximal ureteral dilatation is slightly improved compared to the prior CT scan. There is some decreasing perinephric stranding. As previously known and described is a ureteral stent which has a proximal potential migrated into the mid ureter and is encrusted with calcification or stone formation. There are some additional small stones along the course of the ureters such as image 63 of series 2 measuring 5 mm as seen previously. The distal pigtail is in the bladder and is encased in stone which itself would measure up to 5.1 cm in diameter. The bladder is underdistended and has diffuse thickening. Some of the areas are slightly nodular including of the left UVJ. New from previous is moderate dilatation of the left renal collecting system there is heterogeneous enhancement of the left kidney with some stranding. Please correlate for signs of infection or pyelonephritis. There is also areas of wall thickening along the course of the ureter in the left pelvis such as series 2, image 58 and specifically at the UVJ. This has of uncertain etiology. Recommend further evaluation when appropriate. No clear stones along the course of the left renal collecting system. Stomach/Bowel: Stomach is within normal limits. Appendix appears normal. No evidence of bowel wall thickening, distention, or inflammatory changes. Vascular/Lymphatic: Normal caliber aorta and IVC with some mild atherosclerotic calcified plaque. There are several prominent borderline lymph nodes identified in the retroperitoneum. A few are actually pathologically enlarged. Overall these however similar to slightly decreased compared to the previous. The aortocaval node  which previously had short axis 16 mm, today on series 2, image 34 would have short axis dimension of 12 mm. Reproductive: Uterus and bilateral adnexa are unremarkable. Other: Anasarca.  Mesenteric stranding.  No free air or fluid. Musculoskeletal: Mild curvature and degenerative changes along the spine. Critical Value/emergent results were called by telephone at the time of interpretation on 04/13/2023 at 5:29 pm to provider Willy Eddy , who verbally acknowledged these results. IMPRESSION: Development of moderate left-sided renal collecting system dilatation with the areas of urothelial thickening of the UVJ and along the upper pelvic ureter. No associated stones. The kidney itself also is some heterogeneous enhancement with stranding which can be seen in pyelonephritis. Once again there is severe dilatation of the right renal collecting system with moderate to severe parenchymal atrophy of the kidney. Known right ureteral stent with migration of the upper pigtail into the mid ureter. The both pigtails are encrusted as previously described. Separate right pelvic ureter stone. Please correlate with known history. The encrusted distal pigtail is with a large area of calcification measuring up to 5.1 cm. The associated bladder has areas of nodular asymmetric wall thickening including towards the area of the UVJ regions. Previous cholecystectomy with increasing biliary ductal dilatation down to the level of the ampulla. Further workup when clinically appropriate such as MRCP. Electronically Signed   By: Karen Kays M.D.   On: 04/13/2023 20:31   DG Chest Portable 1 View  Result Date: 04/13/2023 CLINICAL DATA:  Shortness of breath.  Edema. EXAM: PORTABLE CHEST 1 VIEW COMPARISON:  None Available. FINDINGS: Low lung volume. Mildly increased interstitial markings without  overt pulmonary edema. Bilateral lung fields are otherwise clear. No dense consolidation or lung collapse. Bilateral costophrenic angles are  clear. Normal cardio-mediastinal silhouette. No acute osseous abnormalities. The soft tissues are within normal limits. IMPRESSION: *Mildly increased interstitial markings, which is nonspecific. No overt pulmonary edema. Electronically Signed   By: Jules Schick M.D.   On: 04/13/2023 19:03   Medications: I have reviewed the patient's current medications. Scheduled Meds: Continuous Infusions:  meropenem (MERREM) IV 1 g (04/15/23 1304)   PRN Meds:.acetaminophen **OR** acetaminophen, HYDROcodone-acetaminophen, ondansetron **OR** ondansetron (ZOFRAN) IV   Assessment: Principal Problem:   Choledocholithiasis Active Problems:   History of substance abuse (HCC)   Anxiety   Hypokalemia   Essential hypertension   Acute pyelonephritis   AKI (acute kidney injury) (HCC)   Abnormal LFTs   History of cholecystectomy   Hydronephrosis of left kidney   Retained right ureteral stent   E coli bacteremia   Sepsis (HCC)    Plan: This patient had an ERCP with stone extraction yesterday.  She has no new complaints.  The patient is improving.  Nothing further to do from a GI point of view.  She does not appear to have any side effects from the ERCP done yesterday.  I will sign off.  Please call if any further GI concerns or questions.  We would like to thank you for the opportunity to participate in the care of Avan Ellefsen.    LOS: 2 days   Sherlyn Hay 04/15/2023, 1:39 PM Pager [phone] 7am-5pm  Check AMION for 5pm -7am coverage and on weekends

## 2023-04-15 NOTE — Progress Notes (Addendum)
Progress Note    Kathy Walton  YQM:[id number] DOB: 1975/10/25  DOA: 04/13/2023 PCP: [name]      Brief Narrative:    Medical records reviewed and are as summarized below:  Kathy Walton is a 47 y.o. female  with medical history significant for anxiety, polysubstance use, history of cholecystectomy, kidney and bladder stones and frequent UTIs, with a long urologic history including right ureteral stent(never removed) right hydroureteronephrosis with history of right nephrostomy 10/2022, admitted for sepsis from UTI in May 2024 (left AGAINST MEDICAL ADVICE on that visit),history of urology no-shows.  She presented to the hospital with chest pain, epigastric pain, right flank pain and vomiting. Labs significant for WBC 13,000 with lactic acid 0.9 and urinalysis with positive nitrite large leuks and rare bacteria.  Creatinine 1.14 up from baseline of 0.83 with elevated LFTs with AST 123, ALT 63, alk phos 319 and total bili 3.7 with normal lipase.  LFTs were WNL in February 2024.     She was found to have E. coli bacteremia, elevated liver enzymes, dilated common bile duct and intrahepatic ducts with suspected choledocholithiasis.  She also had abnormal urinalysis concerning for UTI.     Assessment/Plan:   Principal Problem:   Choledocholithiasis Active Problems:   Acute pyelonephritis   Retained right ureteral stent   Sepsis (HCC)   Abnormal LFTs   Hydronephrosis of left kidney   E coli bacteremia   Hypokalemia   AKI (acute kidney injury) (HCC)   History of cholecystectomy   History of substance abuse (HCC)   Anxiety   Essential hypertension    Body mass index is 27.37 kg/m.   Sepsis secondary to ESBL E. coli bacteremia: IV cefepime was changed to IV meropenem on 04/14/2023.  Continue IV meropenem pending blood culture sensitivity report.  Patient has been informed that she may have to complete IV antibiotics in the hospital depending on final culture  report. Suspected acute UTI,?  Left kidney pyelonephritis: Urine culture pending but I think urinary tract infection is unlikely.   Elevated liver enzymes, dilated common bile duct, choledocholithiasis:  Liver enzymes are trending down. S/p ERCP on 04/14/2023 with CBD stone removal. History of cholecystectomy   Right ureteral stent, severe right hydroureteronephrosis, moderate left hydroureteronephrosis, 5 cm bladder stone, right ureteral stone, retroperitoneal adenopathy:  Analgesics as needed for pain. She was evaluated by Dr. Gabrielle Dare, urologist.  Nephrostomy tubes were recommended.  However, patient refused to have nephrostomy tube.  She will follow-up with [facility] urology for further evaluation. Repeat CT abdomen and pelvis in 3 months recommended for further evaluation of retroperitoneal adenopathy.   Hypokalemia: Improved   AKI: Creatinine is stable.  Continue IV fluids and repeat BMP tomorrow   Right breast lump: Outpatient follow-up with general surgeon is strongly recommended for further evaluation.   Other comorbidities including hypertension, anxiety, history of substance use disorder (cocaine and marijuana)  Diet Order             Diet regular Fluid consistency: Thin  Diet effective now                            Consultants: Gastroenterologist Urologist  Procedures: ERCP on 04/14/2023    Medications:    Continuous Infusions:  lactated ringers 75 mL/hr at 04/15/23 0348   meropenem (MERREM) IV 1 g (04/15/23 0342)     Anti-infectives (From admission, onward)    Start  Dose/Rate Route Frequency Ordered Stop   04/14/23 1945  meropenem (MERREM) 1 g in sodium chloride 0.9 % 100 mL IVPB        1 g 200 mL/hr over 30 Minutes Intravenous Every 8 hours 04/14/23 1852     04/14/23 0800  ceFEPIme (MAXIPIME) 2 g in sodium chloride 0.9 % 100 mL IVPB  Status:  Discontinued        2 g 200 mL/hr over 30 Minutes Intravenous Every 12 hours 04/13/23 2312  04/14/23 1852   04/13/23 1845  ceFEPIme (MAXIPIME) 2 g in sodium chloride 0.9 % 100 mL IVPB        2 g 200 mL/hr over 30 Minutes Intravenous  Once 04/13/23 1841 04/13/23 2021              Family Communication/Anticipated D/C date and plan/Code Status   DVT prophylaxis: SCDs Start: 04/13/23 2238     Code Status: Full Code  Family Communication: None Disposition Plan: Plan to discharge home   Status is: Inpatient Remains inpatient appropriate because: E. coli bacteremia       Subjective:   Interval events noted.  She complains of lower abdominal pain from bladder stone.  No vomiting.  She is hungry and wants her diet to be advanced from clear liquid diet to regular diet.  Objective:    Vitals:   04/14/23 1925 04/14/23 2242 04/15/23 0339 04/15/23 0845  BP: (!) 104/52 (!) 90/58 126/89 (!) 103/56  Pulse: 63 67 76 63  Resp: 18 18 18  (!) 2  Temp: 97.6 F (36.4 C) 97.7 F (36.5 C) 97.9 F (36.6 C) 97.9 F (36.6 C)  TempSrc: Oral Oral Oral Oral  SpO2: 97% 97% 100% 96%  Weight:      Height:       No data found.   Intake/Output Summary (Last 24 hours) at 04/15/2023 1152 Last data filed at 04/15/2023 0800 Gross per 24 hour  Intake 854.15 ml  Output 650 ml  Net 204.15 ml   Filed Weights   04/13/23 1751  Weight: 81.6 kg    Exam:    GEN: NAD SKIN: No rash EYES: No pallor or icterus ENT: MMM CV: RRR PULM: CTA B ABD: soft, ND, NT, +BS CNS: AAO x 3, non focal EXT: No edema or tenderness GU: No CVA tenderness    Data Reviewed:   I have personally reviewed following labs and imaging studies:  Labs: Labs show the following:   Basic Metabolic Panel: Recent Labs  Lab 04/13/23 1757 04/14/23 0954 04/15/23 0553  NA 134* 136 136  K 3.2* 3.5 3.8  CL 98 98 101  CO2 23 27 27   GLUCOSE 162* 111* 88  BUN 9 9 10   CREATININE 1.14* 1.24* 1.22*  CALCIUM 8.8* 8.5* 8.3*  MG  --  2.1  --   PHOS  --  3.4  --    GFR Estimated Creatinine Clearance:  64.6 mL/min (A) (by C-G formula based on SCr of 1.22 mg/dL (H)). Liver Function Tests: Recent Labs  Lab 04/13/23 1757 04/14/23 0954 04/15/23 0553  AST 123* 130* 86*  ALT 63* 76* 59*  ALKPHOS 319* 307* 247*  BILITOT 3.7* 4.5* 1.5*  PROT 8.9* 8.5* 7.5  ALBUMIN 3.1* 2.9* 2.6*   Recent Labs  Lab 04/13/23 1757  LIPASE 21   No results for input(s): "AMMONIA" in the last 168 hours. Coagulation profile No results for input(s): "INR", "PROTIME" in the last 168 hours.  CBC: Recent Labs  Lab  04/13/23 1757 04/14/23 0954 04/15/23 0553  WBC 13.2* 12.2* 7.6  NEUTROABS 11.4* 9.3* 4.8  HGB 11.0* 10.6* 9.6*  HCT 33.7* 33.3* 30.3*  MCV 82.8 84.5 85.8  PLT 481* 481* 357   Cardiac Enzymes: No results for input(s): "CKTOTAL", "CKMB", "CKMBINDEX", "TROPONINI" in the last 168 hours. BNP (last 3 results) No results for input(s): "PROBNP" in the last 8760 hours. CBG: No results for input(s): "GLUCAP" in the last 168 hours. D-Dimer: No results for input(s): "DDIMER" in the last 72 hours. Hgb A1c: No results for input(s): "HGBA1C" in the last 72 hours. Lipid Profile: No results for input(s): "CHOL", "HDL", "LDLCALC", "TRIG", "CHOLHDL", "LDLDIRECT" in the last 72 hours. Thyroid function studies: No results for input(s): "TSH", "T4TOTAL", "T3FREE", "THYROIDAB" in the last 72 hours.  Invalid input(s): "FREET3" Anemia work up: No results for input(s): "VITAMINB12", "FOLATE", "FERRITIN", "TIBC", "IRON", "RETICCTPCT" in the last 72 hours. Sepsis Labs: Recent Labs  Lab 04/13/23 1757 04/13/23 2108 04/14/23 0954 04/15/23 0553  WBC 13.2*  --  12.2* 7.6  LATICACIDVEN 1.0 0.9  --   --     Microbiology Recent Results (from the past 240 hour(s))  Culture, blood (single)     Status: Abnormal (Preliminary result)   Collection Time: 04/13/23  7:05 PM   Specimen: BLOOD  Result Value Ref Range Status   Specimen Description   Final    BLOOD BLOOD RIGHT HAND Performed at [facility], [address] [place], [place] [address]    Special Requests   Final    BOTTLES DRAWN AEROBIC AND ANAEROBIC Blood Culture adequate volume Performed at [facility], [address], [place], [place] [address]    Culture  Setup Time   Final    GRAM NEGATIVE RODS ANAEROBIC BOTTLE ONLY Organism ID to follow CRITICAL RESULT CALLED TO, READ BACK BY AND VERIFIED WITH: NATALIE REIFFER @0744  04/14/23 MJU Performed at [facility], [address] [place], [place] [address]    Culture (A)  Final    ESCHERICHIA COLI SUSCEPTIBILITIES TO FOLLOW Performed at [facility], [address] [place], [place] [address]    Report Status PENDING  Incomplete  Blood Culture ID Panel (Reflexed)     Status: Abnormal   Collection Time: 04/13/23  7:05 PM  Result Value Ref Range Status   Enterococcus faecalis NOT DETECTED NOT DETECTED Final   Enterococcus Faecium NOT DETECTED NOT DETECTED Final   Listeria monocytogenes NOT DETECTED NOT DETECTED Final   Staphylococcus species NOT DETECTED NOT DETECTED Final   Staphylococcus aureus (BCID) NOT DETECTED NOT DETECTED Final   Staphylococcus epidermidis NOT DETECTED NOT DETECTED Final   Staphylococcus lugdunensis NOT DETECTED NOT DETECTED Final   Streptococcus species NOT DETECTED NOT DETECTED Final   Streptococcus agalactiae NOT DETECTED NOT DETECTED Final   Streptococcus pneumoniae NOT DETECTED NOT DETECTED Final   Streptococcus pyogenes NOT DETECTED NOT DETECTED Final   A.calcoaceticus-baumannii NOT DETECTED NOT DETECTED Final   Bacteroides fragilis NOT DETECTED NOT DETECTED Final   Enterobacterales DETECTED (A) NOT DETECTED Final    Comment: Enterobacterales represent a large order of gram negative bacteria, not a single organism. CRITICAL RESULT CALLED TO, READ BACK BY AND VERIFIED WITH: NATALIE REIFFER @0744  04/14/23 MJU    Enterobacter cloacae complex NOT DETECTED NOT DETECTED Final   Escherichia coli DETECTED (A) NOT  DETECTED Final    Comment: CRITICAL RESULT CALLED TO, READ BACK BY AND VERIFIED WITH: NATALIE REIFFER @0744  04/14/23 MJU  Klebsiella aerogenes NOT DETECTED NOT DETECTED Final   Klebsiella oxytoca NOT DETECTED NOT DETECTED Final   Klebsiella pneumoniae NOT DETECTED NOT DETECTED Final   Proteus species NOT DETECTED NOT DETECTED Final   Salmonella species NOT DETECTED NOT DETECTED Final   Serratia marcescens NOT DETECTED NOT DETECTED Final   Haemophilus influenzae NOT DETECTED NOT DETECTED Final   Neisseria meningitidis NOT DETECTED NOT DETECTED Final   Pseudomonas aeruginosa NOT DETECTED NOT DETECTED Final   Stenotrophomonas maltophilia NOT DETECTED NOT DETECTED Final   Candida albicans NOT DETECTED NOT DETECTED Final   Candida auris NOT DETECTED NOT DETECTED Final   Candida glabrata NOT DETECTED NOT DETECTED Final   Candida krusei NOT DETECTED NOT DETECTED Final   Candida parapsilosis NOT DETECTED NOT DETECTED Final   Candida tropicalis NOT DETECTED NOT DETECTED Final   Cryptococcus neoformans/gattii NOT DETECTED NOT DETECTED Final   CTX-M ESBL DETECTED (A) NOT DETECTED Final    Comment: CRITICAL RESULT CALLED TO, READ BACK BY AND VERIFIED WITH: NATALIE REIFFER @0744  04/14/23 MJU (NOTE) Extended spectrum beta-lactamase detected. Recommend a carbapenem as initial therapy.      Carbapenem resistance IMP NOT DETECTED NOT DETECTED Final   Carbapenem resistance KPC NOT DETECTED NOT DETECTED Final   Carbapenem resistance NDM NOT DETECTED NOT DETECTED Final   Carbapenem resist OXA 48 LIKE NOT DETECTED NOT DETECTED Final   Carbapenem resistance VIM NOT DETECTED NOT DETECTED Final    Comment: Performed at [facility], [address], [place], [place] [address]    Procedures and diagnostic studies:  DG C-Arm 1-60 Min-No Report  Result Date: 04/14/2023 Fluoroscopy was utilized by the requesting physician.  No radiographic interpretation.   MR 3D Recon At  Scanner  Result Date: 04/14/2023 : CLINICAL DATA: Elevated LFTs and right upper quadrant pain EXAM: MRI ABDOMEN WITHOUT AND WITH CONTRAST (INCLUDING MRCP) TECHNIQUE: Multiplanar multisequence MR imaging of the abdomen was performed both before and after the administration of intravenous contrast. Heavily T2-weighted images of the biliary and pancreatic ducts were obtained, and three-dimensional MRCP images were rendered by post processing. CONTRAST: 8mL GADAVIST GADOBUTROL 1 MMOL/ML IV SOLN COMPARISON: None Available. FINDINGS: Lower chest: No acute findings. Hepatobiliary: No enhancing liver lesions. Cholecystectomy. Common bile duct and intrahepatic bile duct dilatation identified. CBD measures 1.4 cm in maximum diameter. Filling defect within the distal common bile duct just before the ampulla is identified concerning for choledocholithiasis. This measures 7 mm in maximum diameter, image 27/4. Pancreas: No mass, inflammatory changes, or other parenchymal abnormality identified. Spleen: Spleen measures 13.4 cm in cranial caudal dimension. No focal abnormality. Adrenals/Urinary Tract: Normal adrenal glands. Severe right-sided hydronephrosis. Right hydroureter identified. As noted on the previous exam there is a ureteral stent with distal migration with proximal pigtail in the mid ureter. Persistent moderate left hydronephrosis and hydroureter. Stomach/Bowel: Visualized portions within the abdomen are unremarkable. Vascular/Lymphatic: Normal caliber of the abdominal aorta. Patent upper abdominal vascularity. Retroperitoneal adenopathy is again noted. Aortocaval lymph node measures 1 cm short axis, image 54/25. Periaortic node measures 1.1 cm, image 61/25. Other: None. Musculoskeletal: No suspicious bone lesions identified. IMPRESSION: 1. Common bile duct and intrahepatic bile duct dilatation identified. Filling defect within the distal common bile duct just before the ampulla is identified concerning for  choledocholithiasis. This measures 7 mm in maximum diameter. 2. Severe right-sided hydronephrosis and hydroureter. As noted on the previous exam there is a ureteral stent with distal migration with proximal pigtail in the mid ureter. 3. Persistent moderate left  hydronephrosis and hydroureter. 4. Retroperitoneal adenopathy is again noted. Etiology is indeterminate and potentially reactive. In the absence of a known malignancy recommend follow-up imaging with repeat CT of the abdomen in 3 months. 5. Mild splenomegaly. Electronically Signed   By: Signa Kell M.D.   On: 04/14/2023 10:55   MR ABDOMEN MRCP W WO CONTAST  Result Date: 04/14/2023 CLINICAL DATA:  Elevated LFTs and right upper quadrant pain EXAM: MRI ABDOMEN WITHOUT AND WITH CONTRAST (INCLUDING MRCP) TECHNIQUE: Multiplanar multisequence MR imaging of the abdomen was performed both before and after the administration of intravenous contrast. Heavily T2-weighted images of the biliary and pancreatic ducts were obtained, and three-dimensional MRCP images were rendered by post processing. CONTRAST:  8mL GADAVIST GADOBUTROL 1 MMOL/ML IV SOLN COMPARISON:  None Available. FINDINGS: Lower chest: No acute findings. Hepatobiliary: No enhancing liver lesions. Cholecystectomy. Common bile duct and intrahepatic bile duct dilatation identified. CBD measures 1.4 cm in maximum diameter. Filling defect within the distal common bile duct just before the ampulla is identified concerning for choledocholithiasis. This measures 7 mm in maximum diameter, image 27/4. Pancreas: No mass, inflammatory changes, or other parenchymal abnormality identified. Spleen: Spleen measures 13.4 cm in cranial caudal dimension. No focal abnormality. Adrenals/Urinary Tract: Normal adrenal glands. Severe right-sided hydronephrosis. Right hydroureter identified. As noted on the previous exam there is a ureteral stent with distal migration with proximal pigtail in the mid ureter. Persistent  moderate left hydronephrosis and hydroureter. Stomach/Bowel: Visualized portions within the abdomen are unremarkable. Vascular/Lymphatic: Normal caliber of the abdominal aorta. Patent upper abdominal vascularity. Retroperitoneal adenopathy is again noted. Aortocaval lymph node measures 1 cm short axis, image 54/25. Periaortic node measures 1.1 cm, image 61/25. Other:  None. Musculoskeletal: No suspicious bone lesions identified. IMPRESSION: 1. Common bile duct and intrahepatic bile duct dilatation identified. Filling defect within the distal common bile duct just before the ampulla is identified concerning for choledocholithiasis. This measures 7 mm in maximum diameter. 2. Severe right-sided hydronephrosis and hydroureter. As noted on the previous exam there is a ureteral stent with distal migration with proximal pigtail in the mid ureter. 3. Persistent moderate left hydronephrosis and hydroureter. 4. Retroperitoneal adenopathy is again noted. Etiology is indeterminate and potentially reactive. In the absence of a known malignancy recommend follow-up imaging with repeat CT of the abdomen in 3 months. 5. Mild splenomegaly. Electronically Signed   By: Signa Kell M.D.   On: 04/14/2023 04:56   CT ABDOMEN PELVIS W CONTRAST  Result Date: 04/13/2023 CLINICAL DATA:  Chest and abdominal pain. Vomiting. History of known stone disease and previous stent placement with complication. EXAM: CT ABDOMEN AND PELVIS WITH CONTRAST TECHNIQUE: Multidetector CT imaging of the abdomen and pelvis was performed using the standard protocol following bolus administration of intravenous contrast. RADIATION DOSE REDUCTION: This exam was performed according to the departmental dose-optimization program which includes automated exposure control, adjustment of the mA and/or kV according to patient size and/or use of iterative reconstruction technique. CONTRAST:  OMNIPAQUE IOHEXOL 300 MG/ML  SOLN COMPARISON:  CT 11/04/2022 without  contrast and older. FINDINGS: Lower chest: Slight dependent ground-glass along bases with some reticular changes and septal thickening. No pleural effusion. Hepatobiliary: Previous cholecystectomy. There is intra hepatic biliary duct ectasia and ectasia of the extrahepatic common duct measuring up to 17 mm in diameter. The common duct does taper towards the pancreatic head however to some extent. This has increased from previous. Would recommend further workup when appropriate such as MRCP. Patent portal vein. No space-occupying  liver lesion. Pancreas: Unremarkable. No pancreatic ductal dilatation or surrounding inflammatory changes. Spleen: Normal in size without focal abnormality. Adrenals/Urinary Tract: Adrenal glands are preserved. There is moderate parenchymal atrophy of the right kidney with severe ductal dilatation. The intrarenal and proximal ureteral dilatation is slightly improved compared to the prior CT scan. There is some decreasing perinephric stranding. As previously known and described is a ureteral stent which has a proximal potential migrated into the mid ureter and is encrusted with calcification or stone formation. There are some additional small stones along the course of the ureters such as image 63 of series 2 measuring 5 mm as seen previously. The distal pigtail is in the bladder and is encased in stone which itself would measure up to 5.1 cm in diameter. The bladder is underdistended and has diffuse thickening. Some of the areas are slightly nodular including of the left UVJ. New from previous is moderate dilatation of the left renal collecting system there is heterogeneous enhancement of the left kidney with some stranding. Please correlate for signs of infection or pyelonephritis. There is also areas of wall thickening along the course of the ureter in the left pelvis such as series 2, image 58 and specifically at the UVJ. This has of uncertain etiology. Recommend further evaluation when  appropriate. No clear stones along the course of the left renal collecting system. Stomach/Bowel: Stomach is within normal limits. Appendix appears normal. No evidence of bowel wall thickening, distention, or inflammatory changes. Vascular/Lymphatic: Normal caliber aorta and IVC with some mild atherosclerotic calcified plaque. There are several prominent borderline lymph nodes identified in the retroperitoneum. A few are actually pathologically enlarged. Overall these however similar to slightly decreased compared to the previous. The aortocaval node which previously had short axis 16 mm, today on series 2, image 34 would have short axis dimension of 12 mm. Reproductive: Uterus and bilateral adnexa are unremarkable. Other: Anasarca.  Mesenteric stranding.  No free air or fluid. Musculoskeletal: Mild curvature and degenerative changes along the spine. Critical Value/emergent results were called by telephone at the time of interpretation on 04/13/2023 at 5:29 pm to provider Willy Eddy , who verbally acknowledged these results. IMPRESSION: Development of moderate left-sided renal collecting system dilatation with the areas of urothelial thickening of the UVJ and along the upper pelvic ureter. No associated stones. The kidney itself also is some heterogeneous enhancement with stranding which can be seen in pyelonephritis. Once again there is severe dilatation of the right renal collecting system with moderate to severe parenchymal atrophy of the kidney. Known right ureteral stent with migration of the upper pigtail into the mid ureter. The both pigtails are encrusted as previously described. Separate right pelvic ureter stone. Please correlate with known history. The encrusted distal pigtail is with a large area of calcification measuring up to 5.1 cm. The associated bladder has areas of nodular asymmetric wall thickening including towards the area of the UVJ regions. Previous cholecystectomy with increasing  biliary ductal dilatation down to the level of the ampulla. Further workup when clinically appropriate such as MRCP. Electronically Signed   By: Karen Kays M.D.   On: 04/13/2023 20:31   DG Chest Portable 1 View  Result Date: 04/13/2023 CLINICAL DATA:  Shortness of breath.  Edema. EXAM: PORTABLE CHEST 1 VIEW COMPARISON:  None Available. FINDINGS: Low lung volume. Mildly increased interstitial markings without overt pulmonary edema. Bilateral lung fields are otherwise clear. No dense consolidation or lung collapse. Bilateral costophrenic angles are clear. Normal cardio-mediastinal silhouette. No  acute osseous abnormalities. The soft tissues are within normal limits. IMPRESSION: *Mildly increased interstitial markings, which is nonspecific. No overt pulmonary edema. Electronically Signed   By: Jules Schick M.D.   On: 04/13/2023 19:03               LOS: 2 days   Kathy Walton  [facility]   Pager on [email or web address]. If 7PM-7AM, please contact night-coverage at [email or web address]     04/15/2023, 11:52 AM

## 2023-04-16 DIAGNOSIS — K8050 Calculus of bile duct without cholangitis or cholecystitis without obstruction: Secondary | ICD-10-CM | POA: Diagnosis not present

## 2023-04-16 LAB — CBC
HCT: 26.9 % — ABNORMAL LOW (ref 36.0–46.0)
Hemoglobin: 8.9 g/dL — ABNORMAL LOW (ref 12.0–15.0)
MCH: 27.6 pg (ref 26.0–34.0)
MCHC: 33.1 g/dL (ref 30.0–36.0)
MCV: 83.5 fL (ref 80.0–100.0)
Platelets: 372 10*3/uL (ref 150–400)
RBC: 3.22 MIL/uL — ABNORMAL LOW (ref 3.87–5.11)
RDW: 17.4 % — ABNORMAL HIGH (ref 11.5–15.5)
WBC: 7 10*3/uL (ref 4.0–10.5)
nRBC: 0 % (ref 0.0–0.2)

## 2023-04-16 LAB — CULTURE, BLOOD (SINGLE): Special Requests: ADEQUATE

## 2023-04-16 LAB — COMPREHENSIVE METABOLIC PANEL
ALT: 45 U/L — ABNORMAL HIGH (ref 0–44)
AST: 48 U/L — ABNORMAL HIGH (ref 15–41)
Albumin: 2.2 g/dL — ABNORMAL LOW (ref 3.5–5.0)
Alkaline Phosphatase: 248 U/L — ABNORMAL HIGH (ref 38–126)
Anion gap: 6 (ref 5–15)
BUN: 12 mg/dL (ref 6–20)
CO2: 28 mmol/L (ref 22–32)
Calcium: 7.7 mg/dL — ABNORMAL LOW (ref 8.9–10.3)
Chloride: 102 mmol/L (ref 98–111)
Creatinine, Ser: 1.09 mg/dL — ABNORMAL HIGH (ref 0.44–1.00)
GFR, Estimated: >60 mL/min (ref >60)
Glucose, Bld: 97 mg/dL (ref 70–99)
Potassium: 3.5 mmol/L (ref 3.5–5.1)
Sodium: 136 mmol/L (ref 135–145)
Total Bilirubin: 0.7 mg/dL (ref 0.3–1.2)
Total Protein: 7 g/dL (ref 6.5–8.1)

## 2023-04-16 LAB — FERRITIN: Ferritin: 426 ng/mL — ABNORMAL HIGH (ref 11–307)

## 2023-04-16 LAB — IRON AND TIBC
Iron: 32 ug/dL (ref 28–170)
Saturation Ratios: 20 % (ref 10.4–31.8)
TIBC: 164 ug/dL — ABNORMAL LOW (ref 250–450)
UIBC: 132 ug/dL

## 2023-04-16 LAB — VITAMIN B12: Vitamin B-12: 297 pg/mL (ref 180–914)

## 2023-04-16 MED ORDER — HYDROCODONE-ACETAMINOPHEN 5-325 MG PO TABS
1.0000 | ORAL_TABLET | Freq: Four times a day (QID) | ORAL | Status: DC | PRN
  Administered 2023-04-16: 2 via ORAL
  Filled 2023-04-16: qty 2

## 2023-04-16 NOTE — Progress Notes (Signed)
Progress Note    Kathy Walton  MWU:[id number] DOB: [birth year]  DOA: 04/13/2023 PCP: [name]      Brief Narrative:    Medical records reviewed and are as summarized below:  Leaha Rodell is a 47 y.o. female  with medical history significant for anxiety, polysubstance use, history of cholecystectomy, kidney and bladder stones and frequent UTIs, with a long urologic history including right ureteral stent(never removed) right hydroureteronephrosis with history of right nephrostomy 10/2022, admitted for sepsis from UTI in May 2024 (left AGAINST MEDICAL ADVICE on that visit),history of urology no-shows.  She presented to the hospital with chest pain, epigastric pain, right flank pain and vomiting. Labs significant for WBC 13,000 with lactic acid 0.9 and urinalysis with positive nitrite large leuks and rare bacteria.  Creatinine 1.14 up from baseline of 0.83 with elevated LFTs with AST 123, ALT 63, alk phos 319 and total bili 3.7 with normal lipase.  LFTs were WNL in February 2024.     She was found to have E. coli bacteremia, elevated liver enzymes, dilated common bile duct and intrahepatic ducts with suspected choledocholithiasis.  She also had abnormal urinalysis concerning for UTI.     Assessment/Plan:   Principal Problem:   Choledocholithiasis Active Problems:   Acute pyelonephritis   Retained right ureteral stent   Sepsis (HCC)   Abnormal LFTs   Hydronephrosis of left kidney   E coli bacteremia   Hypokalemia   AKI (acute kidney injury) (HCC)   History of cholecystectomy   History of substance abuse (HCC)   Anxiety   Essential hypertension    Body mass index is 27.37 kg/m.   Sepsis secondary to ESBL E. coli bacteremia: Blood culture showed multidrug-resistant ESBL E. coli, sensitive to gentamicin, imipenem and Zosyn.  Continue IV meropenem with plan to treat for total of 7 days.   Suspected acute UTI,?  Left kidney pyelonephritis: Urine culture  pending but I think urinary tract infection is unlikely.   Elevated liver enzymes, dilated common bile duct, choledocholithiasis:  Liver enzymes are improving. S/p ERCP on 04/14/2023 with CBD stone removal. History of cholecystectomy   Right ureteral stent, severe right hydroureteronephrosis, moderate left hydroureteronephrosis, 5 cm bladder stone, right ureteral stone, retroperitoneal adenopathy:  Analgesics as needed for pain. She was evaluated by Dr. Gabrielle Dare, urologist.  Nephrostomy tubes were recommended.  However, patient refused to have nephrostomy tube.  She will follow-up with [facility] urology for further evaluation. Repeat CT abdomen and pelvis in 3 months recommended for further evaluation of retroperitoneal adenopathy.   Hypokalemia: Improved   AKI: Creatinine is trending down.  Discontinue IV fluids   Anemia of chronic disease: Hemoglobin trending down but no indication for blood transfusion at this time.  She is asymptomatic.  Iron studies not consistent with iron deficiency anemia.   Right breast lump: Outpatient follow-up with general surgeon is strongly recommended for further evaluation.   Other comorbidities including hypertension, anxiety, history of substance use disorder (cocaine and marijuana)  Diet Order             Diet regular Fluid consistency: Thin  Diet effective now                            Consultants: Gastroenterologist Urologist  Procedures: ERCP on 04/14/2023    Medications:    Continuous Infusions:  meropenem (MERREM) IV 1 g (04/16/23 0257)     Anti-infectives (From admission, onward)  Start     Dose/Rate Route Frequency Ordered Stop   04/14/23 1945  meropenem (MERREM) 1 g in sodium chloride 0.9 % 100 mL IVPB        1 g 200 mL/hr over 30 Minutes Intravenous Every 8 hours 04/14/23 1852     04/14/23 0800  ceFEPIme (MAXIPIME) 2 g in sodium chloride 0.9 % 100 mL IVPB  Status:  Discontinued        2 g 200 mL/hr  over 30 Minutes Intravenous Every 12 hours 04/13/23 2312 04/14/23 1852   04/13/23 1845  ceFEPIme (MAXIPIME) 2 g in sodium chloride 0.9 % 100 mL IVPB        2 g 200 mL/hr over 30 Minutes Intravenous  Once 04/13/23 1841 04/13/23 2021              Family Communication/Anticipated D/C date and plan/Code Status   DVT prophylaxis: SCDs Start: 04/13/23 2238     Code Status: Full Code  Family Communication: None Disposition Plan: Plan to discharge home   Status is: Inpatient Remains inpatient appropriate because: E. coli bacteremia       Subjective:   Interval events noted.  Patient was sleepy and said that she did not want to talk much.  Heather, RN at bedside  Objective:    Vitals:   04/16/23 0003 04/16/23 0253 04/16/23 0400 04/16/23 0904  BP: 119/60 125/64  121/70  Pulse: 76 80  77  Resp: 18 16  20   Temp: 98.7 F (37.1 C) (!) 100.4 F (38 C) 99.2 F (37.3 C) 98.9 F (37.2 C)  TempSrc: Oral Oral Oral Oral  SpO2: 98% 97%  96%  Weight:      Height:       No data found.   Intake/Output Summary (Last 24 hours) at 04/16/2023 0923 Last data filed at 04/16/2023 0400 Gross per 24 hour  Intake 863.75 ml  Output 500 ml  Net 363.75 ml   Filed Weights   04/13/23 1751  Weight: 81.6 kg    Exam:  GEN: NAD SKIN: Warm and dry EYES: No acute abnormality noted ENT: MMM CV: RRR PULM: CTA B ABD: soft, ND, NT, +BS CNS: Drowsy but arousable EXT: No edema or tenderness   Heather, RN at bedside   Data Reviewed:   I have personally reviewed following labs and imaging studies:  Labs: Labs show the following:   Basic Metabolic Panel: Recent Labs  Lab 04/13/23 1757 04/14/23 0954 04/15/23 0553 04/16/23 0527  NA 134* 136 136 136  K 3.2* 3.5 3.8 3.5  CL 98 98 101 102  CO2 23 27 27 28   GLUCOSE 162* 111* 88 97  BUN 9 9 10 12   CREATININE 1.14* 1.24* 1.22* 1.09*  CALCIUM 8.8* 8.5* 8.3* 7.7*  MG  --  2.1  --   --   PHOS  --  3.4  --   --     GFR Estimated Creatinine Clearance: 72.3 mL/min (A) (by C-G formula based on SCr of 1.09 mg/dL (H)). Liver Function Tests: Recent Labs  Lab 04/13/23 1757 04/14/23 0954 04/15/23 0553 04/16/23 0527  AST 123* 130* 86* 48*  ALT 63* 76* 59* 45*  ALKPHOS 319* 307* 247* 248*  BILITOT 3.7* 4.5* 1.5* 0.7  PROT 8.9* 8.5* 7.5 7.0  ALBUMIN 3.1* 2.9* 2.6* 2.2*   Recent Labs  Lab 04/13/23 1757  LIPASE 21   No results for input(s): "AMMONIA" in the last 168 hours. Coagulation profile No results for input(s): "INR", "  PROTIME" in the last 168 hours.  CBC: Recent Labs  Lab 04/13/23 1757 04/14/23 0954 04/15/23 0553 04/16/23 0527  WBC 13.2* 12.2* 7.6 7.0  NEUTROABS 11.4* 9.3* 4.8  --   HGB 11.0* 10.6* 9.6* 8.9*  HCT 33.7* 33.3* 30.3* 26.9*  MCV 82.8 84.5 85.8 83.5  PLT 481* 481* 357 372   Cardiac Enzymes: No results for input(s): "CKTOTAL", "CKMB", "CKMBINDEX", "TROPONINI" in the last 168 hours. BNP (last 3 results) No results for input(s): "PROBNP" in the last 8760 hours. CBG: No results for input(s): "GLUCAP" in the last 168 hours. D-Dimer: No results for input(s): "DDIMER" in the last 72 hours. Hgb A1c: No results for input(s): "HGBA1C" in the last 72 hours. Lipid Profile: No results for input(s): "CHOL", "HDL", "LDLCALC", "TRIG", "CHOLHDL", "LDLDIRECT" in the last 72 hours. Thyroid function studies: No results for input(s): "TSH", "T4TOTAL", "T3FREE", "THYROIDAB" in the last 72 hours.  Invalid input(s): "FREET3" Anemia work up: No results for input(s): "VITAMINB12", "FOLATE", "FERRITIN", "TIBC", "IRON", "RETICCTPCT" in the last 72 hours. Sepsis Labs: Recent Labs  Lab 04/13/23 1757 04/13/23 2108 04/14/23 0954 04/15/23 0553 04/16/23 0527  WBC 13.2*  --  12.2* 7.6 7.0  LATICACIDVEN 1.0 0.9  --   --   --     Microbiology Recent Results (from the past 240 hour(s))  Culture, blood (single)     Status: Abnormal (Preliminary result)   Collection Time: 04/13/23   7:05 PM   Specimen: BLOOD  Result Value Ref Range Status   Specimen Description   Final    BLOOD BLOOD RIGHT HAND Performed at [facility], [address] [place], [place] [address]    Special Requests   Final    BOTTLES DRAWN AEROBIC AND ANAEROBIC Blood Culture adequate volume Performed at [facility], [address] [place], [place] [address]    Culture  Setup Time   Final    GRAM NEGATIVE RODS ANAEROBIC BOTTLE ONLY Organism ID to follow CRITICAL RESULT CALLED TO, READ BACK BY AND VERIFIED WITH: NATALIE REIFFER @0744  04/14/23 MJU Performed at [facility], [address], [place], [place] [address]    Culture (A)  Final    ESCHERICHIA COLI SUSCEPTIBILITIES TO FOLLOW Performed at [facility] : [facility], [address], [place], [place] [address]    Report Status PENDING  Incomplete  Blood Culture ID Panel (Reflexed)     Status: Abnormal   Collection Time: 04/13/23  7:05 PM  Result Value Ref Range Status   Enterococcus faecalis NOT DETECTED NOT DETECTED Final   Enterococcus Faecium NOT DETECTED NOT DETECTED Final   Listeria monocytogenes NOT DETECTED NOT DETECTED Final   Staphylococcus species NOT DETECTED NOT DETECTED Final   Staphylococcus aureus (BCID) NOT DETECTED NOT DETECTED Final   Staphylococcus epidermidis NOT DETECTED NOT DETECTED Final   Staphylococcus lugdunensis NOT DETECTED NOT DETECTED Final   Streptococcus species NOT DETECTED NOT DETECTED Final   Streptococcus agalactiae NOT DETECTED NOT DETECTED Final   Streptococcus pneumoniae NOT DETECTED NOT DETECTED Final   Streptococcus pyogenes NOT DETECTED NOT DETECTED Final   A.calcoaceticus-baumannii NOT DETECTED NOT DETECTED Final   Bacteroides fragilis NOT DETECTED NOT DETECTED Final   Enterobacterales DETECTED (A) NOT DETECTED Final    Comment: Enterobacterales represent a large order of gram negative bacteria, not a single organism. CRITICAL RESULT CALLED TO, READ BACK BY AND  VERIFIED WITH: NATALIE REIFFER @0744  04/14/23 MJU    Enterobacter cloacae complex NOT DETECTED NOT DETECTED Final   Escherichia  coli DETECTED (A) NOT DETECTED Final    Comment: CRITICAL RESULT CALLED TO, READ BACK BY AND VERIFIED WITH: NATALIE REIFFER @0744  04/14/23 MJU    Klebsiella aerogenes NOT DETECTED NOT DETECTED Final   Klebsiella oxytoca NOT DETECTED NOT DETECTED Final   Klebsiella pneumoniae NOT DETECTED NOT DETECTED Final   Proteus species NOT DETECTED NOT DETECTED Final   Salmonella species NOT DETECTED NOT DETECTED Final   Serratia marcescens NOT DETECTED NOT DETECTED Final   Haemophilus influenzae NOT DETECTED NOT DETECTED Final   Neisseria meningitidis NOT DETECTED NOT DETECTED Final   Pseudomonas aeruginosa NOT DETECTED NOT DETECTED Final   Stenotrophomonas maltophilia NOT DETECTED NOT DETECTED Final   Candida albicans NOT DETECTED NOT DETECTED Final   Candida auris NOT DETECTED NOT DETECTED Final   Candida glabrata NOT DETECTED NOT DETECTED Final   Candida krusei NOT DETECTED NOT DETECTED Final   Candida parapsilosis NOT DETECTED NOT DETECTED Final   Candida tropicalis NOT DETECTED NOT DETECTED Final   Cryptococcus neoformans/gattii NOT DETECTED NOT DETECTED Final   CTX-M ESBL DETECTED (A) NOT DETECTED Final    Comment: CRITICAL RESULT CALLED TO, READ BACK BY AND VERIFIED WITH: NATALIE REIFFER @0744  04/14/23 MJU (NOTE) Extended spectrum beta-lactamase detected. Recommend a carbapenem as initial therapy.      Carbapenem resistance IMP NOT DETECTED NOT DETECTED Final   Carbapenem resistance KPC NOT DETECTED NOT DETECTED Final   Carbapenem resistance NDM NOT DETECTED NOT DETECTED Final   Carbapenem resist OXA 48 LIKE NOT DETECTED NOT DETECTED Final   Carbapenem resistance VIM NOT DETECTED NOT DETECTED Final    Comment: Performed at [facility], [address], [place], [place] [address]    Procedures and diagnostic studies:  DG C-Arm 1-60 Min-No  Report  Result Date: 04/14/2023 Fluoroscopy was utilized by the requesting physician.  No radiographic interpretation.               LOS: 3 days   Lauralye Kinn  [facility]   Pager on [email or web address]. If 7PM-7AM, please contact night-coverage at [email or web address]     04/16/2023, 9:23 AM

## 2023-04-16 NOTE — Progress Notes (Signed)
Patient incontinent of urine. Patient denies knowing that she has to go to restroom. Patient sleeping and wanting to sleep when RN in room throughout the shift. Patient independently out of bed and up to chair with steady gait. Patient sat in chair while RN changed full bed linens. Patient put on new gown. Patient states that she wants to "take a shower after dinner". RN encouraged patient to sit in chair for a while, patient refused sitting in chair and states that  she wants to get back in the bed stating that she was wanting to sleep.

## 2023-04-17 DIAGNOSIS — R7881 Bacteremia: Secondary | ICD-10-CM | POA: Diagnosis not present

## 2023-04-17 DIAGNOSIS — K8050 Calculus of bile duct without cholangitis or cholecystitis without obstruction: Secondary | ICD-10-CM | POA: Diagnosis not present

## 2023-04-17 DIAGNOSIS — B9620 Unspecified Escherichia coli [E. coli] as the cause of diseases classified elsewhere: Secondary | ICD-10-CM | POA: Diagnosis not present

## 2023-04-17 LAB — URINE CULTURE: Culture: NO GROWTH

## 2023-04-17 MED ORDER — SODIUM CHLORIDE 0.9 % IV SOLN: INTRAVENOUS | Status: AC | PRN

## 2023-04-17 MED ORDER — TRAZODONE HCL 50 MG PO TABS
50.0000 mg | ORAL_TABLET | Freq: Every day | ORAL | Status: DC
  Administered 2023-04-17 – 2023-04-20 (×4): 50 mg via ORAL
  Filled 2023-04-17 (×4): qty 1

## 2023-04-17 MED ORDER — HYDROCODONE-ACETAMINOPHEN 5-325 MG PO TABS: 1.0000 | ORAL_TABLET | Freq: Three times a day (TID) | ORAL | Status: DC | PRN

## 2023-04-17 NOTE — Progress Notes (Addendum)
Progress Note    Kathy Walton  ZOX:[id number] DOB: 1975/12/07  DOA: 04/13/2023 PCP: [name]      Brief Narrative:    Medical records reviewed and are as summarized below:  Kathy Walton is a 47 y.o. female  with medical history significant for anxiety, polysubstance use, history of cholecystectomy, kidney and bladder stones and frequent UTIs, with a long urologic history including right ureteral stent(never removed) right hydroureteronephrosis with history of right nephrostomy 10/2022, admitted for sepsis from UTI in May 2024 (left AGAINST MEDICAL ADVICE on that visit),history of urology no-shows.  She presented to the hospital with chest pain, epigastric pain, right flank pain and vomiting. Labs significant for WBC 13,000 with lactic acid 0.9 and urinalysis with positive nitrite large leuks and rare bacteria.  Creatinine 1.14 up from baseline of 0.83 with elevated LFTs with AST 123, ALT 63, alk phos 319 and total bili 3.7 with normal lipase.  LFTs were WNL in February 2024.     She was found to have E. coli bacteremia, elevated liver enzymes, dilated common bile duct and intrahepatic ducts with suspected choledocholithiasis.  She also had abnormal urinalysis concerning for UTI.     Assessment/Plan:   Principal Problem:   Choledocholithiasis Active Problems:   Retained right ureteral stent   Sepsis (HCC)   Abnormal LFTs   Hydronephrosis of left kidney   E coli bacteremia   Hypokalemia   AKI (acute kidney injury) (HCC)   History of cholecystectomy   History of substance abuse (HCC)   Anxiety   Essential hypertension    Body mass index is 27.37 kg/m.   Sepsis secondary to ESBL E. coli bacteremia: Blood culture showed multidrug-resistant ESBL E. coli, sensitive to gentamicin, imipenem and Zosyn.  Continue IV meropenem through 04/20/2023 (total of 7 days).   No growth on urine culture.  UTI has been ruled out.    Elevated liver enzymes, dilated  common bile duct, choledocholithiasis:  Liver enzymes are improving. S/p ERCP on 04/14/2023 with CBD stone removal. History of cholecystectomy   Right ureteral stent, severe right hydroureteronephrosis, moderate left hydroureteronephrosis, 5 cm bladder stone, right ureteral stone, retroperitoneal adenopathy:  Tylenol as needed for pain.  Hydrocodone has been discontinued.  Patient is okay with this. She was evaluated by Dr. Gabrielle Dare, urologist.  Nephrostomy tubes were recommended.  However, patient refused to have nephrostomy tube.  She will follow-up with [facility] urology for further evaluation. Repeat CT abdomen and pelvis in 3 months recommended for further evaluation of retroperitoneal adenopathy.   Hypokalemia: Improved   AKI: Creatinine is trending down.  Discontinue IV fluids   Anemia of chronic disease: Hemoglobin trending down but no indication for blood transfusion at this time.  She is asymptomatic.  Iron studies not consistent with iron deficiency anemia.   Right breast lump: Outpatient follow-up with general surgeon is strongly recommended for further evaluation.   Other comorbidities including hypertension, anxiety, history of substance use disorder (cocaine and marijuana)   Trazodone for sleep at night.  Diet Order             Diet regular Fluid consistency: Thin  Diet effective now                            Consultants: Gastroenterologist Urologist  Procedures: ERCP on 04/14/2023    Medications:    traZODone  50 mg Oral QHS   Continuous Infusions:  sodium chloride  10 mL/hr at 04/17/23 1255   meropenem (MERREM) IV 1 g (04/17/23 1256)     Anti-infectives (From admission, onward)    Start     Dose/Rate Route Frequency Ordered Stop   04/14/23 1945  meropenem (MERREM) 1 g in sodium chloride 0.9 % 100 mL IVPB        1 g 200 mL/hr over 30 Minutes Intravenous Every 8 hours 04/14/23 1852     04/14/23 0800  ceFEPIme (MAXIPIME) 2 g in sodium  chloride 0.9 % 100 mL IVPB  Status:  Discontinued        2 g 200 mL/hr over 30 Minutes Intravenous Every 12 hours 04/13/23 2312 04/14/23 1852   04/13/23 1845  ceFEPIme (MAXIPIME) 2 g in sodium chloride 0.9 % 100 mL IVPB        2 g 200 mL/hr over 30 Minutes Intravenous  Once 04/13/23 1841 04/13/23 2021              Family Communication/Anticipated D/C date and plan/Code Status   DVT prophylaxis: SCDs Start: 04/13/23 2238     Code Status: Full Code  Family Communication: None Disposition Plan: Plan to discharge home   Status is: Inpatient Remains inpatient appropriate because: E. coli bacteremia       Subjective:   No acute events overnight.  She said she is still feeling sleepy.  Overall, abdominal pain is not too bad.  She requested medicine to help her sleep at night.  Objective:    Vitals:   04/16/23 1655 04/16/23 1949 04/16/23 2333 04/17/23 1208  BP: 120/71 (!) 145/73 124/68 123/66  Pulse: 69 80 70 80  Resp: 16 20 18 20   Temp: 98.6 F (37 C) 98.6 F (37 C) 98.6 F (37 C) 99.9 F (37.7 C)  TempSrc: Oral Oral Oral Oral  SpO2: 98% 99% 97% 99%  Weight:      Height:       No data found.   Intake/Output Summary (Last 24 hours) at 04/17/2023 1411 Last data filed at 04/17/2023 0436 Gross per 24 hour  Intake 443.62 ml  Output 1900 ml  Net -1456.38 ml   Filed Weights   04/13/23 1751  Weight: 81.6 kg    Exam:  GEN: NAD SKIN: No rash EYES: No pallor or icterus ENT: MMM CV: RRR PULM: CTA B ABD: soft, ND, NT, +BS CNS: AAO x 3, non focal EXT: No edema or tenderness    Data Reviewed:   I have personally reviewed following labs and imaging studies:  Labs: Labs show the following:   Basic Metabolic Panel: Recent Labs  Lab 04/13/23 1757 04/14/23 0954 04/15/23 0553 04/16/23 0527  NA 134* 136 136 136  K 3.2* 3.5 3.8 3.5  CL 98 98 101 102  CO2 23 27 27 28   GLUCOSE 162* 111* 88 97  BUN 9 9 10 12   CREATININE 1.14* 1.24* 1.22* 1.09*   CALCIUM 8.8* 8.5* 8.3* 7.7*  MG  --  2.1  --   --   PHOS  --  3.4  --   --    GFR Estimated Creatinine Clearance: 72.3 mL/min (A) (by C-G formula based on SCr of 1.09 mg/dL (H)). Liver Function Tests: Recent Labs  Lab 04/13/23 1757 04/14/23 0954 04/15/23 0553 04/16/23 0527  AST 123* 130* 86* 48*  ALT 63* 76* 59* 45*  ALKPHOS 319* 307* 247* 248*  BILITOT 3.7* 4.5* 1.5* 0.7  PROT 8.9* 8.5* 7.5 7.0  ALBUMIN 3.1* 2.9* 2.6* 2.2*  Recent Labs  Lab 04/13/23 1757  LIPASE 21   No results for input(s): "AMMONIA" in the last 168 hours. Coagulation profile No results for input(s): "INR", "PROTIME" in the last 168 hours.  CBC: Recent Labs  Lab 04/13/23 1757 04/14/23 0954 04/15/23 0553 04/16/23 0527  WBC 13.2* 12.2* 7.6 7.0  NEUTROABS 11.4* 9.3* 4.8  --   HGB 11.0* 10.6* 9.6* 8.9*  HCT 33.7* 33.3* 30.3* 26.9*  MCV 82.8 84.5 85.8 83.5  PLT 481* 481* 357 372   Cardiac Enzymes: No results for input(s): "CKTOTAL", "CKMB", "CKMBINDEX", "TROPONINI" in the last 168 hours. BNP (last 3 results) No results for input(s): "PROBNP" in the last 8760 hours. CBG: No results for input(s): "GLUCAP" in the last 168 hours. D-Dimer: No results for input(s): "DDIMER" in the last 72 hours. Hgb A1c: No results for input(s): "HGBA1C" in the last 72 hours. Lipid Profile: No results for input(s): "CHOL", "HDL", "LDLCALC", "TRIG", "CHOLHDL", "LDLDIRECT" in the last 72 hours. Thyroid function studies: No results for input(s): "TSH", "T4TOTAL", "T3FREE", "THYROIDAB" in the last 72 hours.  Invalid input(s): "FREET3" Anemia work up: Recent Labs    04/16/23 0526 04/16/23 1241  VITAMINB12  --  297  FERRITIN 426*  --   TIBC 164*  --   IRON 32  --    Sepsis Labs: Recent Labs  Lab 04/13/23 1757 04/13/23 2108 04/14/23 0954 04/15/23 0553 04/16/23 0527  WBC 13.2*  --  12.2* 7.6 7.0  LATICACIDVEN 1.0 0.9  --   --   --     Microbiology Recent Results (from the past 240 hour(s))   Culture, blood (single)     Status: Abnormal   Collection Time: 04/13/23  7:05 PM   Specimen: BLOOD  Result Value Ref Range Status   Specimen Description   Final    BLOOD BLOOD RIGHT HAND Performed at [facility], [address] [place], [place] [address]    Special Requests   Final    BOTTLES DRAWN AEROBIC AND ANAEROBIC Blood Culture adequate volume Performed at [facility], [address], [place], [place] [address]    Culture  Setup Time   Final    GRAM NEGATIVE RODS ANAEROBIC BOTTLE ONLY Organism ID to follow CRITICAL RESULT CALLED TO, READ BACK BY AND VERIFIED WITH: NATALIE REIFFER @0744  04/14/23 MJU Performed at [facility], [address] [place], [place] [address]    Culture (A)  Final    ESCHERICHIA COLI Confirmed Extended Spectrum Beta-Lactamase Producer (ESBL).  In bloodstream infections from ESBL organisms, carbapenems are preferred over piperacillin/tazobactam. They are shown to have a lower risk of mortality.    Report Status 04/16/2023 FINAL  Final   Organism ID, Bacteria ESCHERICHIA COLI  Final   Organism ID, Bacteria ESCHERICHIA COLI  Final      Susceptibility   Escherichia coli - MIC*    AMPICILLIN >=32 RESISTANT Resistant     CEFEPIME 16 RESISTANT Resistant     CEFTAZIDIME RESISTANT Resistant     CEFTRIAXONE >=64 RESISTANT Resistant     CIPROFLOXACIN >=4 RESISTANT Resistant     GENTAMICIN <=1 SENSITIVE Sensitive     IMIPENEM <=0.25 SENSITIVE Sensitive     TRIMETH/SULFA >=320 RESISTANT Resistant     AMPICILLIN/SULBACTAM >=32 RESISTANT Resistant     PIP/TAZO <=4 SENSITIVE Sensitive ug/mL   Escherichia coli - KIRBY BAUER*    CEFAZOLIN RESISTANT Resistant     * ESCHERICHIA COLI    ESCHERICHIA COLI  Blood Culture ID Panel (  Reflexed)     Status: Abnormal   Collection Time: 04/13/23  7:05 PM  Result Value Ref Range Status   Enterococcus faecalis NOT DETECTED NOT DETECTED Final   Enterococcus Faecium NOT DETECTED NOT  DETECTED Final   Listeria monocytogenes NOT DETECTED NOT DETECTED Final   Staphylococcus species NOT DETECTED NOT DETECTED Final   Staphylococcus aureus (BCID) NOT DETECTED NOT DETECTED Final   Staphylococcus epidermidis NOT DETECTED NOT DETECTED Final   Staphylococcus lugdunensis NOT DETECTED NOT DETECTED Final   Streptococcus species NOT DETECTED NOT DETECTED Final   Streptococcus agalactiae NOT DETECTED NOT DETECTED Final   Streptococcus pneumoniae NOT DETECTED NOT DETECTED Final   Streptococcus pyogenes NOT DETECTED NOT DETECTED Final   A.calcoaceticus-baumannii NOT DETECTED NOT DETECTED Final   Bacteroides fragilis NOT DETECTED NOT DETECTED Final   Enterobacterales DETECTED (A) NOT DETECTED Final    Comment: Enterobacterales represent a large order of gram negative bacteria, not a single organism. CRITICAL RESULT CALLED TO, READ BACK BY AND VERIFIED WITH: NATALIE REIFFER @0744  04/14/23 MJU    Enterobacter cloacae complex NOT DETECTED NOT DETECTED Final   Escherichia coli DETECTED (A) NOT DETECTED Final    Comment: CRITICAL RESULT CALLED TO, READ BACK BY AND VERIFIED WITH: NATALIE REIFFER @0744  04/14/23 MJU    Klebsiella aerogenes NOT DETECTED NOT DETECTED Final   Klebsiella oxytoca NOT DETECTED NOT DETECTED Final   Klebsiella pneumoniae NOT DETECTED NOT DETECTED Final   Proteus species NOT DETECTED NOT DETECTED Final   Salmonella species NOT DETECTED NOT DETECTED Final   Serratia marcescens NOT DETECTED NOT DETECTED Final   Haemophilus influenzae NOT DETECTED NOT DETECTED Final   Neisseria meningitidis NOT DETECTED NOT DETECTED Final   Pseudomonas aeruginosa NOT DETECTED NOT DETECTED Final   Stenotrophomonas maltophilia NOT DETECTED NOT DETECTED Final   Candida albicans NOT DETECTED NOT DETECTED Final   Candida auris NOT DETECTED NOT DETECTED Final   Candida glabrata NOT DETECTED NOT DETECTED Final   Candida krusei NOT DETECTED NOT DETECTED Final   Candida parapsilosis NOT  DETECTED NOT DETECTED Final   Candida tropicalis NOT DETECTED NOT DETECTED Final   Cryptococcus neoformans/gattii NOT DETECTED NOT DETECTED Final   CTX-M ESBL DETECTED (A) NOT DETECTED Final    Comment: CRITICAL RESULT CALLED TO, READ BACK BY AND VERIFIED WITH: NATALIE REIFFER @0744  04/14/23 MJU (NOTE) Extended spectrum beta-lactamase detected. Recommend a carbapenem as initial therapy.      Carbapenem resistance IMP NOT DETECTED NOT DETECTED Final   Carbapenem resistance KPC NOT DETECTED NOT DETECTED Final   Carbapenem resistance NDM NOT DETECTED NOT DETECTED Final   Carbapenem resist OXA 48 LIKE NOT DETECTED NOT DETECTED Final   Carbapenem resistance VIM NOT DETECTED NOT DETECTED Final    Comment: Performed at [facility], [address] [place], [place] [address]  Urine Culture (for pregnant, neutropenic or urologic patients or patients with an indwelling urinary catheter)     Status: None   Collection Time: 04/15/23  9:00 PM   Specimen: Urine, Clean Catch  Result Value Ref Range Status   Specimen Description   Final    URINE, CLEAN CATCH Performed at [facility], [address] [place], [place] [address]    Special Requests   Final    NONE Performed at [facility], [address] [place], [place] [address]    Culture   Final    NO GROWTH Performed at [facility], [address], [place], [place] [address]  Report Status 04/17/2023 FINAL  Final    Procedures and diagnostic studies:  No results found.             LOS: 4 days   Nilda Keathley  [facility]   Pager on [email or web address]. If 7PM-7AM, please contact night-coverage at [email or web address]     04/17/2023, 2:11 PM

## 2023-04-18 ENCOUNTER — Encounter: Payer: Self-pay | Admitting: Gastroenterology

## 2023-04-18 DIAGNOSIS — K8050 Calculus of bile duct without cholangitis or cholecystitis without obstruction: Secondary | ICD-10-CM | POA: Diagnosis not present

## 2023-04-18 DIAGNOSIS — B9620 Unspecified Escherichia coli [E. coli] as the cause of diseases classified elsewhere: Secondary | ICD-10-CM | POA: Diagnosis not present

## 2023-04-18 DIAGNOSIS — R7881 Bacteremia: Secondary | ICD-10-CM | POA: Diagnosis not present

## 2023-04-18 LAB — CBC
HCT: 31.3 % — ABNORMAL LOW (ref 36.0–46.0)
Hemoglobin: 10 g/dL — ABNORMAL LOW (ref 12.0–15.0)
MCH: 27 pg (ref 26.0–34.0)
MCHC: 31.9 g/dL (ref 30.0–36.0)
MCV: 84.6 fL (ref 80.0–100.0)
Platelets: 391 10*3/uL (ref 150–400)
RBC: 3.7 MIL/uL — ABNORMAL LOW (ref 3.87–5.11)
RDW: 18.2 % — ABNORMAL HIGH (ref 11.5–15.5)
WBC: 7.8 10*3/uL (ref 4.0–10.5)
nRBC: 0 % (ref 0.0–0.2)

## 2023-04-18 LAB — COMPREHENSIVE METABOLIC PANEL
ALT: 31 U/L (ref 0–44)
AST: 28 U/L (ref 15–41)
Albumin: 2.7 g/dL — ABNORMAL LOW (ref 3.5–5.0)
Alkaline Phosphatase: 190 U/L — ABNORMAL HIGH (ref 38–126)
Anion gap: 10 (ref 5–15)
BUN: 20 mg/dL (ref 6–20)
CO2: 25 mmol/L (ref 22–32)
Calcium: 8.7 mg/dL — ABNORMAL LOW (ref 8.9–10.3)
Chloride: 101 mmol/L (ref 98–111)
Creatinine, Ser: 0.96 mg/dL (ref 0.44–1.00)
GFR, Estimated: >60 mL/min (ref >60)
Glucose, Bld: 117 mg/dL — ABNORMAL HIGH (ref 70–99)
Potassium: 3.9 mmol/L (ref 3.5–5.1)
Sodium: 136 mmol/L (ref 135–145)
Total Bilirubin: 0.5 mg/dL (ref 0.3–1.2)
Total Protein: 7.7 g/dL (ref 6.5–8.1)

## 2023-04-18 MED ORDER — HYDROCODONE-ACETAMINOPHEN 5-325 MG PO TABS
1.0000 | ORAL_TABLET | Freq: Three times a day (TID) | ORAL | Status: DC | PRN
  Administered 2023-04-18 – 2023-04-21 (×8): 1 via ORAL
  Filled 2023-04-18 (×8): qty 1

## 2023-04-18 MED ORDER — IBUPROFEN 800 MG PO TABS: 400.0000 mg | ORAL_TABLET | Freq: Four times a day (QID) | ORAL | Status: DC | PRN

## 2023-04-18 MED ORDER — ACETAMINOPHEN 325 MG PO TABS: 650.0000 mg | ORAL_TABLET | Freq: Four times a day (QID) | ORAL | Status: DC | PRN

## 2023-04-18 NOTE — Progress Notes (Signed)
Progress Note    Kathy Walton  ZOX:[id number] DOB: 1975/11/05  DOA: 04/13/2023 PCP: [name]      Brief Narrative:    Medical records reviewed and are as summarized below:  Kathy Walton is a 47 y.o. female  with medical history significant for anxiety, polysubstance use, history of cholecystectomy, kidney and bladder stones and frequent UTIs, with a long urologic history including right ureteral stent(never removed) right hydroureteronephrosis with history of right nephrostomy 10/2022, admitted for sepsis from UTI in May 2024 (left AGAINST MEDICAL ADVICE on that visit),history of urology no-shows.  She presented to the hospital with chest pain, epigastric pain, right flank pain and vomiting. Labs significant for WBC 13,000 with lactic acid 0.9 and urinalysis with positive nitrite large leuks and rare bacteria.  Creatinine 1.14 up from baseline of 0.83 with elevated LFTs with AST 123, ALT 63, alk phos 319 and total bili 3.7 with normal lipase.  LFTs were WNL in February 2024.     She was found to have E. coli bacteremia, elevated liver enzymes, dilated common bile duct and intrahepatic ducts with suspected choledocholithiasis.  She also had abnormal urinalysis concerning for UTI.     Assessment/Plan:   Principal Problem:   Choledocholithiasis Active Problems:   Retained right ureteral stent   Sepsis (HCC)   Abnormal LFTs   Hydronephrosis of left kidney   E coli bacteremia   Hypokalemia   AKI (acute kidney injury) (HCC)   History of cholecystectomy   History of substance abuse (HCC)   Anxiety   Essential hypertension    Body mass index is 27.37 kg/m.   Sepsis secondary to ESBL E. coli bacteremia: Blood culture showed multidrug-resistant ESBL E. coli, sensitive to gentamicin, imipenem and Zosyn.  Continue IV meropenem through 04/20/2023 (total of 7 days).   No growth on urine culture.  UTI has been ruled out.    Elevated liver enzymes, dilated  common bile duct, choledocholithiasis:  Liver enzymes are improving. S/p ERCP on 04/14/2023 with CBD stone removal. History of cholecystectomy   Right ureteral stent, severe right hydroureteronephrosis, moderate left hydroureteronephrosis, 5 cm bladder stone, right ureteral stone, retroperitoneal adenopathy:  Analgesics as needed for pain. She was evaluated by Dr. Gabrielle Dare, urologist.  Nephrostomy tubes were recommended.  However, patient refused to have nephrostomy tube.  She will follow-up with [facility] urology for further evaluation. Repeat CT abdomen and pelvis in 3 months recommended for further evaluation of retroperitoneal adenopathy.   Hypokalemia: Improved   AKI: Creatinine has improved   Anemia of chronic disease: Hemoglobin trending down but no indication for blood transfusion at this time.  She is asymptomatic.  Iron studies not consistent with iron deficiency anemia.   Right breast lump: Outpatient follow-up with general surgeon is strongly recommended for further evaluation.   Other comorbidities including hypertension, anxiety, history of substance use disorder (cocaine and marijuana)   Trazodone for sleep at night.  Diet Order             Diet regular Fluid consistency: Thin  Diet effective now                            Consultants: Gastroenterologist Urologist  Procedures: ERCP on 04/14/2023    Medications:    traZODone  50 mg Oral QHS   Continuous Infusions:  sodium chloride 10 mL/hr at 04/17/23 2025   meropenem (MERREM) IV 200 mL/hr at 04/18/23 [phone]  Anti-infectives (From admission, onward)    Start     Dose/Rate Route Frequency Ordered Stop   04/14/23 1945  meropenem (MERREM) 1 g in sodium chloride 0.9 % 100 mL IVPB        1 g 200 mL/hr over 30 Minutes Intravenous Every 8 hours 04/14/23 1852     04/14/23 0800  ceFEPIme (MAXIPIME) 2 g in sodium chloride 0.9 % 100 mL IVPB  Status:  Discontinued        2 g 200 mL/hr over 30  Minutes Intravenous Every 12 hours 04/13/23 2312 04/14/23 1852   04/13/23 1845  ceFEPIme (MAXIPIME) 2 g in sodium chloride 0.9 % 100 mL IVPB        2 g 200 mL/hr over 30 Minutes Intravenous  Once 04/13/23 1841 04/13/23 2021              Family Communication/Anticipated D/C date and plan/Code Status   DVT prophylaxis: SCDs Start: 04/13/23 2238     Code Status: Full Code  Family Communication: None Disposition Plan: Plan to discharge home   Status is: Inpatient Remains inpatient appropriate because: E. coli bacteremia       Subjective:   Interval events noted.  She complains of back pain and abdominal pain from a kidney stones.  She requested something stronger for pain.  Objective:    Vitals:   04/17/23 1650 04/17/23 2029 04/17/23 2345 04/18/23 0721  BP: (!) 134/91 128/69 (!) 110/58 129/75  Pulse: 76 74 73 78  Resp: 18 18 17 18   Temp: 98.2 F (36.8 C) 98.8 F (37.1 C) 98.1 F (36.7 C) 98.4 F (36.9 C)  TempSrc:  Oral Oral   SpO2: 99% 96% 98% 99%  Weight:      Height:       No data found.   Intake/Output Summary (Last 24 hours) at 04/18/2023 1207 Last data filed at 04/17/2023 1256 Gross per 24 hour  Intake 0 ml  Output --  Net 0 ml   Filed Weights   04/13/23 1751  Weight: 81.6 kg    Exam:  GEN: NAD SKIN: Warm and dry EYES: No pallor or icterus ENT: MMM CV: RRR PULM: CTA B ABD: soft, ND, NT, +BS CNS: AAO x 3, non focal EXT: No edema or tenderness     Data Reviewed:   I have personally reviewed following labs and imaging studies:  Labs: Labs show the following:   Basic Metabolic Panel: Recent Labs  Lab 04/13/23 1757 04/14/23 0954 04/15/23 0553 04/16/23 0527  NA 134* 136 136 136  K 3.2* 3.5 3.8 3.5  CL 98 98 101 102  CO2 23 27 27 28   GLUCOSE 162* 111* 88 97  BUN 9 9 10 12   CREATININE 1.14* 1.24* 1.22* 1.09*  CALCIUM 8.8* 8.5* 8.3* 7.7*  MG  --  2.1  --   --   PHOS  --  3.4  --   --    GFR Estimated Creatinine  Clearance: 72.3 mL/min (A) (by C-G formula based on SCr of 1.09 mg/dL (H)). Liver Function Tests: Recent Labs  Lab 04/13/23 1757 04/14/23 0954 04/15/23 0553 04/16/23 0527  AST 123* 130* 86* 48*  ALT 63* 76* 59* 45*  ALKPHOS 319* 307* 247* 248*  BILITOT 3.7* 4.5* 1.5* 0.7  PROT 8.9* 8.5* 7.5 7.0  ALBUMIN 3.1* 2.9* 2.6* 2.2*   Recent Labs  Lab 04/13/23 1757  LIPASE 21   No results for input(s): "AMMONIA" in the last 168 hours. Coagulation  profile No results for input(s): "INR", "PROTIME" in the last 168 hours.  CBC: Recent Labs  Lab 04/13/23 1757 04/14/23 0954 04/15/23 0553 04/16/23 0527  WBC 13.2* 12.2* 7.6 7.0  NEUTROABS 11.4* 9.3* 4.8  --   HGB 11.0* 10.6* 9.6* 8.9*  HCT 33.7* 33.3* 30.3* 26.9*  MCV 82.8 84.5 85.8 83.5  PLT 481* 481* 357 372   Cardiac Enzymes: No results for input(s): "CKTOTAL", "CKMB", "CKMBINDEX", "TROPONINI" in the last 168 hours. BNP (last 3 results) No results for input(s): "PROBNP" in the last 8760 hours. CBG: No results for input(s): "GLUCAP" in the last 168 hours. D-Dimer: No results for input(s): "DDIMER" in the last 72 hours. Hgb A1c: No results for input(s): "HGBA1C" in the last 72 hours. Lipid Profile: No results for input(s): "CHOL", "HDL", "LDLCALC", "TRIG", "CHOLHDL", "LDLDIRECT" in the last 72 hours. Thyroid function studies: No results for input(s): "TSH", "T4TOTAL", "T3FREE", "THYROIDAB" in the last 72 hours.  Invalid input(s): "FREET3" Anemia work up: Recent Labs    04/16/23 0526 04/16/23 1241  VITAMINB12  --  297  FERRITIN 426*  --   TIBC 164*  --   IRON 32  --    Sepsis Labs: Recent Labs  Lab 04/13/23 1757 04/13/23 2108 04/14/23 0954 04/15/23 0553 04/16/23 0527  WBC 13.2*  --  12.2* 7.6 7.0  LATICACIDVEN 1.0 0.9  --   --   --     Microbiology Recent Results (from the past 240 hour(s))  Culture, blood (single)     Status: Abnormal   Collection Time: 04/13/23  7:05 PM   Specimen: BLOOD  Result Value  Ref Range Status   Specimen Description   Final    BLOOD BLOOD RIGHT HAND Performed at [facility], [address], [place], [place] [address]    Special Requests   Final    BOTTLES DRAWN AEROBIC AND ANAEROBIC Blood Culture adequate volume Performed at [facility], [address] [place], [place] [address]    Culture  Setup Time   Final    GRAM NEGATIVE RODS ANAEROBIC BOTTLE ONLY Organism ID to follow CRITICAL RESULT CALLED TO, READ BACK BY AND VERIFIED WITH: NATALIE REIFFER @0744  04/14/23 MJU Performed at [facility], [address] [place], [place] [address]    Culture (A)  Final    ESCHERICHIA COLI Confirmed Extended Spectrum Beta-Lactamase Producer (ESBL).  In bloodstream infections from ESBL organisms, carbapenems are preferred over piperacillin/tazobactam. They are shown to have a lower risk of mortality.    Report Status 04/16/2023 FINAL  Final   Organism ID, Bacteria ESCHERICHIA COLI  Final   Organism ID, Bacteria ESCHERICHIA COLI  Final      Susceptibility   Escherichia coli - MIC*    AMPICILLIN >=32 RESISTANT Resistant     CEFEPIME 16 RESISTANT Resistant     CEFTAZIDIME RESISTANT Resistant     CEFTRIAXONE >=64 RESISTANT Resistant     CIPROFLOXACIN >=4 RESISTANT Resistant     GENTAMICIN <=1 SENSITIVE Sensitive     IMIPENEM <=0.25 SENSITIVE Sensitive     TRIMETH/SULFA >=320 RESISTANT Resistant     AMPICILLIN/SULBACTAM >=32 RESISTANT Resistant     PIP/TAZO <=4 SENSITIVE Sensitive ug/mL   Escherichia coli - KIRBY BAUER*    CEFAZOLIN RESISTANT Resistant     * ESCHERICHIA COLI    ESCHERICHIA COLI  Blood Culture ID Panel (Reflexed)     Status: Abnormal   Collection Time: 04/13/23  7:05 PM  Result Value Ref Range Status  Enterococcus faecalis NOT DETECTED NOT DETECTED Final   Enterococcus Faecium NOT DETECTED NOT DETECTED Final   Listeria monocytogenes NOT DETECTED NOT DETECTED Final   Staphylococcus species NOT DETECTED NOT DETECTED  Final   Staphylococcus aureus (BCID) NOT DETECTED NOT DETECTED Final   Staphylococcus epidermidis NOT DETECTED NOT DETECTED Final   Staphylococcus lugdunensis NOT DETECTED NOT DETECTED Final   Streptococcus species NOT DETECTED NOT DETECTED Final   Streptococcus agalactiae NOT DETECTED NOT DETECTED Final   Streptococcus pneumoniae NOT DETECTED NOT DETECTED Final   Streptococcus pyogenes NOT DETECTED NOT DETECTED Final   A.calcoaceticus-baumannii NOT DETECTED NOT DETECTED Final   Bacteroides fragilis NOT DETECTED NOT DETECTED Final   Enterobacterales DETECTED (A) NOT DETECTED Final    Comment: Enterobacterales represent a large order of gram negative bacteria, not a single organism. CRITICAL RESULT CALLED TO, READ BACK BY AND VERIFIED WITH: NATALIE REIFFER @0744  04/14/23 MJU    Enterobacter cloacae complex NOT DETECTED NOT DETECTED Final   Escherichia coli DETECTED (A) NOT DETECTED Final    Comment: CRITICAL RESULT CALLED TO, READ BACK BY AND VERIFIED WITH: NATALIE REIFFER @0744  04/14/23 MJU    Klebsiella aerogenes NOT DETECTED NOT DETECTED Final   Klebsiella oxytoca NOT DETECTED NOT DETECTED Final   Klebsiella pneumoniae NOT DETECTED NOT DETECTED Final   Proteus species NOT DETECTED NOT DETECTED Final   Salmonella species NOT DETECTED NOT DETECTED Final   Serratia marcescens NOT DETECTED NOT DETECTED Final   Haemophilus influenzae NOT DETECTED NOT DETECTED Final   Neisseria meningitidis NOT DETECTED NOT DETECTED Final   Pseudomonas aeruginosa NOT DETECTED NOT DETECTED Final   Stenotrophomonas maltophilia NOT DETECTED NOT DETECTED Final   Candida albicans NOT DETECTED NOT DETECTED Final   Candida auris NOT DETECTED NOT DETECTED Final   Candida glabrata NOT DETECTED NOT DETECTED Final   Candida krusei NOT DETECTED NOT DETECTED Final   Candida parapsilosis NOT DETECTED NOT DETECTED Final   Candida tropicalis NOT DETECTED NOT DETECTED Final   Cryptococcus neoformans/gattii NOT  DETECTED NOT DETECTED Final   CTX-M ESBL DETECTED (A) NOT DETECTED Final    Comment: CRITICAL RESULT CALLED TO, READ BACK BY AND VERIFIED WITH: NATALIE REIFFER @0744  04/14/23 MJU (NOTE) Extended spectrum beta-lactamase detected. Recommend a carbapenem as initial therapy.      Carbapenem resistance IMP NOT DETECTED NOT DETECTED Final   Carbapenem resistance KPC NOT DETECTED NOT DETECTED Final   Carbapenem resistance NDM NOT DETECTED NOT DETECTED Final   Carbapenem resist OXA 48 LIKE NOT DETECTED NOT DETECTED Final   Carbapenem resistance VIM NOT DETECTED NOT DETECTED Final    Comment: Performed at [facility], [address] [place], [place] [address]  Urine Culture (for pregnant, neutropenic or urologic patients or patients with an indwelling urinary catheter)     Status: None   Collection Time: 04/15/23  9:00 PM   Specimen: Urine, Clean Catch  Result Value Ref Range Status   Specimen Description   Final    URINE, CLEAN CATCH Performed at [facility], [address], [place], [place] [address]    Special Requests   Final    NONE Performed at [facility], [address] [place], [place] [address]    Culture   Final    NO GROWTH Performed at [facility], [address] [place], [place] [address]    Report Status 04/17/2023 FINAL  Final    Procedures and diagnostic studies:  No results found.  LOS: 5 days   Joshue Badal  [facility]   Pager on [email or web address]. If 7PM-7AM, please contact night-coverage at [email or web address]     04/18/2023, 12:07 PM

## 2023-04-19 DIAGNOSIS — K8050 Calculus of bile duct without cholangitis or cholecystitis without obstruction: Secondary | ICD-10-CM | POA: Diagnosis not present

## 2023-04-19 MED ORDER — SODIUM CHLORIDE 0.9 % IV SOLN
1.0000 g | INTRAVENOUS | Status: AC
  Administered 2023-04-20 – 2023-04-21 (×2): 1 g via INTRAVENOUS
  Filled 2023-04-19 (×2): qty 1000

## 2023-04-19 NOTE — Progress Notes (Signed)
[facility]  - [place] at [facility]   PATIENT NAME: Kathy Walton    MR#:  [id number]  DATE OF BIRTH:  [birth year]  SUBJECTIVE:  no family at bedside. Laying in bed comfortably. Complains of back pain. Encouraged her to ambulate and sit in the chair. Tolerating PO diet. No issues per RN    VITALS:  Blood pressure 126/73, pulse 73, temperature 98.5 F (36.9 C), temperature source Oral, resp. rate 20, height 5\' 8"  (1.727 m), weight 81.6 kg, SpO2 97%, unknown if currently breastfeeding.  PHYSICAL EXAMINATION:   GENERAL:  47 y.o.-year-old patient with no acute distress. Disheveled LUNGS: Normal breath sounds bilaterally, no wheezing CARDIOVASCULAR: S1, S2 normal. No murmur   ABDOMEN: Soft, nontender, nondistended. Bowel sounds present.  EXTREMITIES: No  edema b/l.    NEUROLOGIC: nonfocal  patient is alert and awake  LABORATORY PANEL:  CBC Recent Labs  Lab 04/18/23 1218  WBC 7.8  HGB 10.0*  HCT 31.3*  PLT 391    Chemistries  Recent Labs  Lab 04/14/23 0954 04/15/23 0553 04/18/23 1218  NA 136   < > 136  K 3.5   < > 3.9  CL 98   < > 101  CO2 27   < > 25  GLUCOSE 111*   < > 117*  BUN 9   < > 20  CREATININE 1.24*   < > 0.96  CALCIUM 8.5*   < > 8.7*  MG 2.1  --   --   AST 130*   < > 28  ALT 76*   < > 31  ALKPHOS 307*   < > 190*  BILITOT 4.5*   < > 0.5   < > = values in this interval not displayed.   Assessment and Plan  Kathy Walton is a 47 y.o. female  with medical history significant for anxiety, polysubstance use, history of cholecystectomy, kidney and bladder stones and frequent UTIs, with a long urologic history including right ureteral stent(never removed) right hydroureteronephrosis with history of right nephrostomy 10/2022, admitted for sepsis from UTI in May 2024 (left AGAINST MEDICAL ADVICE on that visit),history of urology no-shows.  She presented to the hospital with chest pain, epigastric pain, right flank pain and vomiting.   She was found  to have E. coli bacteremia, elevated liver enzymes, dilated common bile duct and intrahepatic ducts with suspected choledocholithiasis. She also had abnormal urinalysis concerning for UTI.   Sepsis secondary to ESBL E. coli bacteremia:  --Blood culture showed multidrug-resistant ESBL E. coli, sensitive to gentamicin, imipenem and Zosyn.  -- Continue IV meropenem --IV ertapenem to complete the course (d/w ID RPH) --No growth on urine culture.  UTI has been ruled out.    Elevated liver enzymes, dilated common bile duct, choledocholithiasis:  History of cholecystectomy --Liver enzymes are improving. --S/p ERCP on 04/14/2023 with CBD stone removal.  Chronic Right ureteral stent, severe right hydroureteronephrosis, moderate left hydroureteronephrosis, 5 cm bladder stone, right ureteral stone, retroperitoneal adenopathy:  --Analgesics as needed for pain. --She was evaluated by Dr. Gabrielle Dare, urologist.  Nephrostomy tubes were recommended.  However, patient refused to have nephrostomy tube.  She will follow-up with [facility] urology for further evaluation. --Repeat CT abdomen and pelvis in 3 months recommended for further evaluation of retroperitoneal adenopathy.    Hypokalemia: Improved    AKI: Creatinine has improved  --1.22---0.9   Anemia of chronic disease: Hemoglobin trending down but no indication for blood transfusion at this time.  She is asymptomatic.  Iron studies not consistent with iron deficiency anemia.   Right breast lump: Outpatient follow-up with general surgeon is strongly recommended for further evaluation.     Other comorbidities including hypertension, anxiety, history of substance use disorder (cocaine and marijuana)  Family communication :none Consults :Urology, GI CODE STATUS: full DVT Prophylaxis :ambulation Level of care: Med-Surg Status is: Inpatient Remains inpatient appropriate because: IV abxs for EBSL Sepsis/uti    TOTAL TIME TAKING CARE OF THIS PATIENT: 35  minutes.  >50% time spent on counselling and coordination of care  Note: This dictation was prepared with Dragon dictation along with smaller phrase technology. Any transcriptional errors that result from this process are unintentional.  Enedina Finner M.D    [facility]   CC: Primary care physician; [name]

## 2023-04-20 DIAGNOSIS — K8050 Calculus of bile duct without cholangitis or cholecystitis without obstruction: Secondary | ICD-10-CM | POA: Diagnosis not present

## 2023-04-20 NOTE — Progress Notes (Signed)
[facility]  - [place] at [facility]   PATIENT NAME: Kathy Walton    MR#:  [id number]  DATE OF BIRTH:  11/30/1975  SUBJECTIVE:  no family at bedside. Laying in bed comfortably. Complains of back pain. Encouraged her to ambulate and sit in the chair. Tolerating PO diet. No issues per RN VITALS:  Blood pressure 115/66, pulse 72, temperature 98.4 F (36.9 C), temperature source Oral, resp. rate 18, height 5\' 8"  (1.727 m), weight 81.6 kg, SpO2 97%, unknown if currently breastfeeding.  PHYSICAL EXAMINATION:   GENERAL:  47 y.o.-year-old patient with no acute distress. Disheveled LUNGS: Normal breath sounds bilaterally, no wheezing CARDIOVASCULAR: S1, S2 normal. No murmur   ABDOMEN: Soft, nontender, nondistended.  EXTREMITIES: No  edema b/l.    NEUROLOGIC: nonfocal  patient is alert and awake  LABORATORY PANEL:  CBC Recent Labs  Lab 04/18/23 1218  WBC 7.8  HGB 10.0*  HCT 31.3*  PLT 391    Chemistries  Recent Labs  Lab 04/14/23 0954 04/15/23 0553 04/18/23 1218  NA 136   < > 136  K 3.5   < > 3.9  CL 98   < > 101  CO2 27   < > 25  GLUCOSE 111*   < > 117*  BUN 9   < > 20  CREATININE 1.24*   < > 0.96  CALCIUM 8.5*   < > 8.7*  MG 2.1  --   --   AST 130*   < > 28  ALT 76*   < > 31  ALKPHOS 307*   < > 190*  BILITOT 4.5*   < > 0.5   < > = values in this interval not displayed.   Assessment and Plan  Kathy Walton is a 47 y.o. female  with medical history significant for anxiety, polysubstance use, history of cholecystectomy, kidney and bladder stones and frequent UTIs, with a long urologic history including right ureteral stent(never removed) right hydroureteronephrosis with history of right nephrostomy 10/2022, admitted for sepsis from UTI in May 2024 (left AGAINST MEDICAL ADVICE on that visit),history of urology no-shows.  She presented to the hospital with chest pain, epigastric pain, right flank pain and vomiting.   She was found to have E. coli bacteremia,  elevated liver enzymes, dilated common bile duct and intrahepatic ducts with suspected choledocholithiasis. She also had abnormal urinalysis concerning for UTI.   Sepsis secondary to ESBL E. coli bacteremia:  --Blood culture showed multidrug-resistant ESBL E. coli, sensitive to gentamicin, imipenem and Zosyn.  -- Continue IV meropenem --IV ertapenem to complete the course (d/w ID RPH) --No growth on urine culture.  UTI has been ruled out.    Elevated liver enzymes, dilated common bile duct, choledocholithiasis:  History of cholecystectomy --Liver enzymes are improving. --S/p ERCP on 04/14/2023 with CBD stone removal.  Chronic Right ureteral stent, severe right hydroureteronephrosis, moderate left hydroureteronephrosis, 5 cm bladder stone, right ureteral stone, retroperitoneal adenopathy:  --Analgesics as needed for pain. --She was evaluated by Dr. Gabrielle Dare, urologist.  Nephrostomy tubes were recommended.  However, patient refused to have nephrostomy tube.  She will follow-up with [facility] urology for further evaluation. --Repeat CT abdomen and pelvis in 3 months recommended for further evaluation of retroperitoneal adenopathy.    Hypokalemia: Improved    AKI: Creatinine has improved  --1.22---0.9   Anemia of chronic disease: Hemoglobin trending down but no indication for blood transfusion at this time.  She is asymptomatic.  Iron studies not consistent with iron  deficiency anemia.   Right breast lump: Outpatient follow-up with general surgeon is strongly recommended for further evaluation.     Other comorbidities including hypertension, anxiety, history of substance use disorder (cocaine and marijuana)  Family communication :none Consults :Urology, GI CODE STATUS: full DVT Prophylaxis :ambulation Level of care: Med-Surg Status is: Inpatient Remains inpatient appropriate because: IV abxs for EBSL Sepsis/uti    TOTAL TIME TAKING CARE OF THIS PATIENT: 35 minutes.  >50% time spent on  counselling and coordination of care  Note: This dictation was prepared with Dragon dictation along with smaller phrase technology. Any transcriptional errors that result from this process are unintentional.  Enedina Finner M.D    [facility]   CC: Primary care physician; [name]

## 2023-04-21 DIAGNOSIS — K8050 Calculus of bile duct without cholangitis or cholecystitis without obstruction: Secondary | ICD-10-CM | POA: Diagnosis not present

## 2023-04-21 NOTE — Progress Notes (Signed)
Patient discharged to friends home via [employer]. Discharge instructions and prescriptions given and reviewed with patient. Patient verbalized understanding. Waiting to be escorted out by auxillary.

## 2023-04-21 NOTE — Progress Notes (Signed)
CSW spoke with patient regarding discharge today, she reports needing ride home via taxi voucher, reports no additional needs.   Taxi to transport to [facility], she reports this is a friends she is staying with.   Darolyn Rua, [place], MSW, [place] [phone]

## 2023-04-21 NOTE — Discharge Instructions (Signed)
Pt advised to f/u [facility]

## 2023-04-21 NOTE — Discharge Summary (Signed)
Physician Discharge Summary   Patient: Kathy Walton MRN: [id number] DOB: 14-Aug-1975  Admit date:     04/13/2023  Discharge date: 04/21/23  Discharge Physician: Enedina Finner   PCP: [name]   Recommendations at discharge:   F/u [facility]--pt to call and make appt F/u PCP in 1-2 weeks  Discharge Diagnoses: Principal Problem:   Choledocholithiasis Active Problems:   Retained right ureteral stent   Sepsis (HCC)   Abnormal LFTs   Hydronephrosis of left kidney   E coli bacteremia   Hypokalemia   AKI (acute kidney injury) (HCC)   History of cholecystectomy   History of substance abuse (HCC)   Anxiety   Essential hypertension  Kathy Walton is a 47 y.o. female  with medical history significant for anxiety, polysubstance use, history of cholecystectomy, kidney and bladder stones and frequent UTIs, with a long urologic history including right ureteral stent(never removed) right hydroureteronephrosis with history of right nephrostomy 10/2022, admitted for sepsis from UTI in May 2024 (left AGAINST MEDICAL ADVICE on that visit),history of urology no-shows.  She presented to the hospital with chest pain, epigastric pain, right flank pain and vomiting.    She was found to have E. coli bacteremia, elevated liver enzymes, dilated common bile duct and intrahepatic ducts with suspected choledocholithiasis. She also had abnormal urinalysis concerning for UTI.    Sepsis secondary to ESBL E. coli bacteremia:  --Blood culture showed multidrug-resistant ESBL E. coli, sensitive to gentamicin, imipenem and Zosyn.  -- Completed IV meropenem/ ertapenem to complete the course (d/w ID [facility]) --No growth on urine culture.  UTI has been ruled out.    Elevated liver enzymes, dilated common bile duct, choledocholithiasis:  History of cholecystectomy --Liver enzymes are improving. --S/p ERCP on 04/14/2023 with CBD stone removal.   Chronic Right ureteral stent, severe right hydroureteronephrosis,  moderate left hydroureteronephrosis, 5 cm bladder stone, right ureteral stone, retroperitoneal adenopathy:  --Analgesics as needed for pain. --She was evaluated by Dr. Gabrielle Dare, urologist.  Nephrostomy tubes were recommended.  However, patient refused to have nephrostomy tube.  She will follow-up with [facility] urology for further evaluation. --Repeat CT abdomen and pelvis in 3 months recommended for further evaluation of retroperitoneal adenopathy.    Hypokalemia: Improved    AKI: Creatinine has improved  --1.22---0.9   Anemia of chronic disease: Hemoglobin trending down but no indication for blood transfusion at this time.  She is asymptomatic.  Iron studies not consistent with iron deficiency anemia.   Right breast lump: Outpatient follow-up with general surgeon is strongly recommended for further evaluation.     Other comorbidities including hypertension, anxiety, history of substance use disorder (cocaine and marijuana)   Family communication :none Consults :Urology, GI CODE STATUS: full DVT Prophylaxis :ambulation  D/c home. Pt agreeable    Procedures performed: ERCP  Disposition: Home Diet recommendation:  Discharge Diet Orders (From admission, onward)     Start     Ordered   04/21/23 0000  Diet - low sodium heart healthy        04/21/23 0946           Regular diet DISCHARGE MEDICATION: Allergies as of 04/21/2023       Reactions   Cephalexin Rash        Medication List     STOP taking these medications    DILAUDID PO   Doxylamine-Pyridoxine ER 20-20 MG Tbcr Commonly known as: Bonjesta   levofloxacin 750 MG tablet Commonly known as: LEVAQUIN   naloxone 4  MG/0.1ML Liqd nasal spray kit Commonly known as: NARCAN   phenazopyridine 100 MG tablet Commonly known as: PYRIDIUM   predniSONE 10 MG tablet Commonly known as: DELTASONE   sodium chloride flush 0.9 % Soln injection       TAKE these medications    ibuprofen 400 MG tablet Commonly known as:  ADVIL Take 400 mg by mouth every 6 (six) hours as needed. What changed: Another medication with the same name was removed. Continue taking this medication, and follow the directions you see here.   ondansetron 4 MG disintegrating tablet Commonly known as: ZOFRAN-ODT Take 1 tablet (4 mg total) by mouth every 8 (eight) hours as needed for nausea or vomiting.   oxybutynin 10 MG 24 hr tablet Commonly known as: DITROPAN-XL Take 1 tablet by mouth daily.        Follow-up Information     [place], [facility]. Schedule an appointment as soon as possible for a visit in 1 week(s).   Contact information: [address] [place] [address] [phone]                 Filed Weights   04/13/23 1751  Weight: 81.6 kg     Condition at discharge: fair  The results of significant diagnostics from this hospitalization (including imaging, microbiology, ancillary and laboratory) are listed below for reference.   Imaging Studies: DG C-Arm 1-60 Min-No Report  Result Date: 04/14/2023 Fluoroscopy was utilized by the requesting physician.  No radiographic interpretation.   MR 3D Recon At Scanner  Result Date: 04/14/2023 : CLINICAL DATA: Elevated LFTs and right upper quadrant pain EXAM: MRI ABDOMEN WITHOUT AND WITH CONTRAST (INCLUDING MRCP) TECHNIQUE: Multiplanar multisequence MR imaging of the abdomen was performed both before and after the administration of intravenous contrast. Heavily T2-weighted images of the biliary and pancreatic ducts were obtained, and three-dimensional MRCP images were rendered by post processing. CONTRAST: 8mL GADAVIST GADOBUTROL 1 MMOL/ML IV SOLN COMPARISON: None Available. FINDINGS: Lower chest: No acute findings. Hepatobiliary: No enhancing liver lesions. Cholecystectomy. Common bile duct and intrahepatic bile duct dilatation identified. CBD measures 1.4 cm in maximum diameter. Filling defect within the distal common bile duct just before the ampulla is  identified concerning for choledocholithiasis. This measures 7 mm in maximum diameter, image 27/4. Pancreas: No mass, inflammatory changes, or other parenchymal abnormality identified. Spleen: Spleen measures 13.4 cm in cranial caudal dimension. No focal abnormality. Adrenals/Urinary Tract: Normal adrenal glands. Severe right-sided hydronephrosis. Right hydroureter identified. As noted on the previous exam there is a ureteral stent with distal migration with proximal pigtail in the mid ureter. Persistent moderate left hydronephrosis and hydroureter. Stomach/Bowel: Visualized portions within the abdomen are unremarkable. Vascular/Lymphatic: Normal caliber of the abdominal aorta. Patent upper abdominal vascularity. Retroperitoneal adenopathy is again noted. Aortocaval lymph node measures 1 cm short axis, image 54/25. Periaortic node measures 1.1 cm, image 61/25. Other: None. Musculoskeletal: No suspicious bone lesions identified. IMPRESSION: 1. Common bile duct and intrahepatic bile duct dilatation identified. Filling defect within the distal common bile duct just before the ampulla is identified concerning for choledocholithiasis. This measures 7 mm in maximum diameter. 2. Severe right-sided hydronephrosis and hydroureter. As noted on the previous exam there is a ureteral stent with distal migration with proximal pigtail in the mid ureter. 3. Persistent moderate left hydronephrosis and hydroureter. 4. Retroperitoneal adenopathy is again noted. Etiology is indeterminate and potentially reactive. In the absence of a known malignancy recommend follow-up imaging with repeat CT of the abdomen in  3 months. 5. Mild splenomegaly. Electronically Signed   By: Signa Kell M.D.   On: 04/14/2023 10:55   MR ABDOMEN MRCP W WO CONTAST  Result Date: 04/14/2023 CLINICAL DATA:  Elevated LFTs and right upper quadrant pain EXAM: MRI ABDOMEN WITHOUT AND WITH CONTRAST (INCLUDING MRCP) TECHNIQUE: Multiplanar multisequence MR imaging  of the abdomen was performed both before and after the administration of intravenous contrast. Heavily T2-weighted images of the biliary and pancreatic ducts were obtained, and three-dimensional MRCP images were rendered by post processing. CONTRAST:  8mL GADAVIST GADOBUTROL 1 MMOL/ML IV SOLN COMPARISON:  None Available. FINDINGS: Lower chest: No acute findings. Hepatobiliary: No enhancing liver lesions. Cholecystectomy. Common bile duct and intrahepatic bile duct dilatation identified. CBD measures 1.4 cm in maximum diameter. Filling defect within the distal common bile duct just before the ampulla is identified concerning for choledocholithiasis. This measures 7 mm in maximum diameter, image 27/4. Pancreas: No mass, inflammatory changes, or other parenchymal abnormality identified. Spleen: Spleen measures 13.4 cm in cranial caudal dimension. No focal abnormality. Adrenals/Urinary Tract: Normal adrenal glands. Severe right-sided hydronephrosis. Right hydroureter identified. As noted on the previous exam there is a ureteral stent with distal migration with proximal pigtail in the mid ureter. Persistent moderate left hydronephrosis and hydroureter. Stomach/Bowel: Visualized portions within the abdomen are unremarkable. Vascular/Lymphatic: Normal caliber of the abdominal aorta. Patent upper abdominal vascularity. Retroperitoneal adenopathy is again noted. Aortocaval lymph node measures 1 cm short axis, image 54/25. Periaortic node measures 1.1 cm, image 61/25. Other:  None. Musculoskeletal: No suspicious bone lesions identified. IMPRESSION: 1. Common bile duct and intrahepatic bile duct dilatation identified. Filling defect within the distal common bile duct just before the ampulla is identified concerning for choledocholithiasis. This measures 7 mm in maximum diameter. 2. Severe right-sided hydronephrosis and hydroureter. As noted on the previous exam there is a ureteral stent with distal migration with proximal  pigtail in the mid ureter. 3. Persistent moderate left hydronephrosis and hydroureter. 4. Retroperitoneal adenopathy is again noted. Etiology is indeterminate and potentially reactive. In the absence of a known malignancy recommend follow-up imaging with repeat CT of the abdomen in 3 months. 5. Mild splenomegaly. Electronically Signed   By: Signa Kell M.D.   On: 04/14/2023 04:56   CT ABDOMEN PELVIS W CONTRAST  Result Date: 04/13/2023 CLINICAL DATA:  Chest and abdominal pain. Vomiting. History of known stone disease and previous stent placement with complication. EXAM: CT ABDOMEN AND PELVIS WITH CONTRAST TECHNIQUE: Multidetector CT imaging of the abdomen and pelvis was performed using the standard protocol following bolus administration of intravenous contrast. RADIATION DOSE REDUCTION: This exam was performed according to the departmental dose-optimization program which includes automated exposure control, adjustment of the mA and/or kV according to patient size and/or use of iterative reconstruction technique. CONTRAST:  OMNIPAQUE IOHEXOL 300 MG/ML  SOLN COMPARISON:  CT 11/04/2022 without contrast and older. FINDINGS: Lower chest: Slight dependent ground-glass along bases with some reticular changes and septal thickening. No pleural effusion. Hepatobiliary: Previous cholecystectomy. There is intra hepatic biliary duct ectasia and ectasia of the extrahepatic common duct measuring up to 17 mm in diameter. The common duct does taper towards the pancreatic head however to some extent. This has increased from previous. Would recommend further workup when appropriate such as MRCP. Patent portal vein. No space-occupying liver lesion. Pancreas: Unremarkable. No pancreatic ductal dilatation or surrounding inflammatory changes. Spleen: Normal in size without focal abnormality. Adrenals/Urinary Tract: Adrenal glands are preserved. There is moderate parenchymal atrophy of the  right kidney with severe ductal  dilatation. The intrarenal and proximal ureteral dilatation is slightly improved compared to the prior CT scan. There is some decreasing perinephric stranding. As previously known and described is a ureteral stent which has a proximal potential migrated into the mid ureter and is encrusted with calcification or stone formation. There are some additional small stones along the course of the ureters such as image 63 of series 2 measuring 5 mm as seen previously. The distal pigtail is in the bladder and is encased in stone which itself would measure up to 5.1 cm in diameter. The bladder is underdistended and has diffuse thickening. Some of the areas are slightly nodular including of the left UVJ. New from previous is moderate dilatation of the left renal collecting system there is heterogeneous enhancement of the left kidney with some stranding. Please correlate for signs of infection or pyelonephritis. There is also areas of wall thickening along the course of the ureter in the left pelvis such as series 2, image 58 and specifically at the UVJ. This has of uncertain etiology. Recommend further evaluation when appropriate. No clear stones along the course of the left renal collecting system. Stomach/Bowel: Stomach is within normal limits. Appendix appears normal. No evidence of bowel wall thickening, distention, or inflammatory changes. Vascular/Lymphatic: Normal caliber aorta and IVC with some mild atherosclerotic calcified plaque. There are several prominent borderline lymph nodes identified in the retroperitoneum. A few are actually pathologically enlarged. Overall these however similar to slightly decreased compared to the previous. The aortocaval node which previously had short axis 16 mm, today on series 2, image 34 would have short axis dimension of 12 mm. Reproductive: Uterus and bilateral adnexa are unremarkable. Other: Anasarca.  Mesenteric stranding.  No free air or fluid. Musculoskeletal: Mild curvature and  degenerative changes along the spine. Critical Value/emergent results were called by telephone at the time of interpretation on 04/13/2023 at 5:29 pm to provider Willy Eddy , who verbally acknowledged these results. IMPRESSION: Development of moderate left-sided renal collecting system dilatation with the areas of urothelial thickening of the UVJ and along the upper pelvic ureter. No associated stones. The kidney itself also is some heterogeneous enhancement with stranding which can be seen in pyelonephritis. Once again there is severe dilatation of the right renal collecting system with moderate to severe parenchymal atrophy of the kidney. Known right ureteral stent with migration of the upper pigtail into the mid ureter. The both pigtails are encrusted as previously described. Separate right pelvic ureter stone. Please correlate with known history. The encrusted distal pigtail is with a large area of calcification measuring up to 5.1 cm. The associated bladder has areas of nodular asymmetric wall thickening including towards the area of the UVJ regions. Previous cholecystectomy with increasing biliary ductal dilatation down to the level of the ampulla. Further workup when clinically appropriate such as MRCP. Electronically Signed   By: Karen Kays M.D.   On: 04/13/2023 20:31   DG Chest Portable 1 View  Result Date: 04/13/2023 CLINICAL DATA:  Shortness of breath.  Edema. EXAM: PORTABLE CHEST 1 VIEW COMPARISON:  None Available. FINDINGS: Low lung volume. Mildly increased interstitial markings without overt pulmonary edema. Bilateral lung fields are otherwise clear. No dense consolidation or lung collapse. Bilateral costophrenic angles are clear. Normal cardio-mediastinal silhouette. No acute osseous abnormalities. The soft tissues are within normal limits. IMPRESSION: *Mildly increased interstitial markings, which is nonspecific. No overt pulmonary edema. Electronically Signed   By: Timoteo Expose.D.  On: 04/13/2023 19:03    Microbiology: Results for orders placed or performed during the hospital encounter of 04/13/23  Culture, blood (single)     Status: Abnormal   Collection Time: 04/13/23  7:05 PM   Specimen: BLOOD  Result Value Ref Range Status   Specimen Description   Final    BLOOD BLOOD RIGHT HAND Performed at [facility], [address] [place], [place] [address]    Special Requests   Final    BOTTLES DRAWN AEROBIC AND ANAEROBIC Blood Culture adequate volume Performed at [facility], [address] [place], [place] [address]    Culture  Setup Time   Final    GRAM NEGATIVE RODS ANAEROBIC BOTTLE ONLY Organism ID to follow CRITICAL RESULT CALLED TO, READ BACK BY AND VERIFIED WITH: NATALIE REIFFER @0744  04/14/23 MJU Performed at [facility], [address] [place], [place] [address]    Culture (A)  Final    ESCHERICHIA COLI Confirmed Extended Spectrum Beta-Lactamase Producer (ESBL).  In bloodstream infections from ESBL organisms, carbapenems are preferred over piperacillin/tazobactam. They are shown to have a lower risk of mortality.    Report Status 04/16/2023 FINAL  Final   Organism ID, Bacteria ESCHERICHIA COLI  Final   Organism ID, Bacteria ESCHERICHIA COLI  Final      Susceptibility   Escherichia coli - MIC*    AMPICILLIN >=32 RESISTANT Resistant     CEFEPIME 16 RESISTANT Resistant     CEFTAZIDIME RESISTANT Resistant     CEFTRIAXONE >=64 RESISTANT Resistant     CIPROFLOXACIN >=4 RESISTANT Resistant     GENTAMICIN <=1 SENSITIVE Sensitive     IMIPENEM <=0.25 SENSITIVE Sensitive     TRIMETH/SULFA >=320 RESISTANT Resistant     AMPICILLIN/SULBACTAM >=32 RESISTANT Resistant     PIP/TAZO <=4 SENSITIVE Sensitive ug/mL   Escherichia coli - KIRBY BAUER*    CEFAZOLIN RESISTANT Resistant     * ESCHERICHIA COLI    ESCHERICHIA COLI  Blood Culture ID Panel (Reflexed)     Status: Abnormal   Collection Time: 04/13/23  7:05 PM  Result Value  Ref Range Status   Enterococcus faecalis NOT DETECTED NOT DETECTED Final   Enterococcus Faecium NOT DETECTED NOT DETECTED Final   Listeria monocytogenes NOT DETECTED NOT DETECTED Final   Staphylococcus species NOT DETECTED NOT DETECTED Final   Staphylococcus aureus (BCID) NOT DETECTED NOT DETECTED Final   Staphylococcus epidermidis NOT DETECTED NOT DETECTED Final   Staphylococcus lugdunensis NOT DETECTED NOT DETECTED Final   Streptococcus species NOT DETECTED NOT DETECTED Final   Streptococcus agalactiae NOT DETECTED NOT DETECTED Final   Streptococcus pneumoniae NOT DETECTED NOT DETECTED Final   Streptococcus pyogenes NOT DETECTED NOT DETECTED Final   A.calcoaceticus-baumannii NOT DETECTED NOT DETECTED Final   Bacteroides fragilis NOT DETECTED NOT DETECTED Final   Enterobacterales DETECTED (A) NOT DETECTED Final    Comment: Enterobacterales represent a large order of gram negative bacteria, not a single organism. CRITICAL RESULT CALLED TO, READ BACK BY AND VERIFIED WITH: NATALIE REIFFER @0744  04/14/23 MJU    Enterobacter cloacae complex NOT DETECTED NOT DETECTED Final   Escherichia coli DETECTED (A) NOT DETECTED Final    Comment: CRITICAL RESULT CALLED TO, READ BACK BY AND VERIFIED WITH: NATALIE REIFFER @0744  04/14/23 MJU    Klebsiella aerogenes NOT DETECTED NOT DETECTED Final   Klebsiella oxytoca NOT DETECTED NOT DETECTED Final   Klebsiella pneumoniae NOT DETECTED NOT DETECTED Final   Proteus species NOT DETECTED NOT DETECTED Final  Salmonella species NOT DETECTED NOT DETECTED Final   Serratia marcescens NOT DETECTED NOT DETECTED Final   Haemophilus influenzae NOT DETECTED NOT DETECTED Final   Neisseria meningitidis NOT DETECTED NOT DETECTED Final   Pseudomonas aeruginosa NOT DETECTED NOT DETECTED Final   Stenotrophomonas maltophilia NOT DETECTED NOT DETECTED Final   Candida albicans NOT DETECTED NOT DETECTED Final   Candida auris NOT DETECTED NOT DETECTED Final   Candida  glabrata NOT DETECTED NOT DETECTED Final   Candida krusei NOT DETECTED NOT DETECTED Final   Candida parapsilosis NOT DETECTED NOT DETECTED Final   Candida tropicalis NOT DETECTED NOT DETECTED Final   Cryptococcus neoformans/gattii NOT DETECTED NOT DETECTED Final   CTX-M ESBL DETECTED (A) NOT DETECTED Final    Comment: CRITICAL RESULT CALLED TO, READ BACK BY AND VERIFIED WITH: NATALIE REIFFER @0744  04/14/23 MJU (NOTE) Extended spectrum beta-lactamase detected. Recommend a carbapenem as initial therapy.      Carbapenem resistance IMP NOT DETECTED NOT DETECTED Final   Carbapenem resistance KPC NOT DETECTED NOT DETECTED Final   Carbapenem resistance NDM NOT DETECTED NOT DETECTED Final   Carbapenem resist OXA 48 LIKE NOT DETECTED NOT DETECTED Final   Carbapenem resistance VIM NOT DETECTED NOT DETECTED Final    Comment: Performed at [facility], [address] [place], [place] [address]  Urine Culture (for pregnant, neutropenic or urologic patients or patients with an indwelling urinary catheter)     Status: None   Collection Time: 04/15/23  9:00 PM   Specimen: Urine, Clean Catch  Result Value Ref Range Status   Specimen Description   Final    URINE, CLEAN CATCH Performed at [facility], [address] [place], [place] [address]    Special Requests   Final    NONE Performed at [facility], [address], [place], [place] [address]    Culture   Final    NO GROWTH Performed at [facility], [address] [place], [place] [address]    Report Status 04/17/2023 FINAL  Final    Labs: CBC: Recent Labs  Lab 04/14/23 0954 04/15/23 0553 04/16/23 0527 04/18/23 1218  WBC 12.2* 7.6 7.0 7.8  NEUTROABS 9.3* 4.8  --   --   HGB 10.6* 9.6* 8.9* 10.0*  HCT 33.3* 30.3* 26.9* 31.3*  MCV 84.5 85.8 83.5 84.6  PLT 481* 357 372 391   Basic Metabolic Panel: Recent Labs  Lab 04/14/23 0954 04/15/23 0553 04/16/23 0527 04/18/23 1218  NA 136 136 136  136  K 3.5 3.8 3.5 3.9  CL 98 101 102 101  CO2 27 27 28 25   GLUCOSE 111* 88 97 117*  BUN 9 10 12 20   CREATININE 1.24* 1.22* 1.09* 0.96  CALCIUM 8.5* 8.3* 7.7* 8.7*  MG 2.1  --   --   --   PHOS 3.4  --   --   --    Liver Function Tests: Recent Labs  Lab 04/14/23 0954 04/15/23 0553 04/16/23 0527 04/18/23 1218  AST 130* 86* 48* 28  ALT 76* 59* 45* 31  ALKPHOS 307* 247* 248* 190*  BILITOT 4.5* 1.5* 0.7 0.5  PROT 8.5* 7.5 7.0 7.7  ALBUMIN 2.9* 2.6* 2.2* 2.7*    Discharge time spent: greater than 30 minutes.  Signed: Enedina Finner, MD [facility] 04/21/2023

## 2024-01-26 IMAGING — CT CT RENAL STONE PROTOCOL
2 of 4 series · 16 of 46 positions shown, 18 images · non-contrast
Comparison: None Available.

CLINICAL DATA: Flank pain with kidney stone suspected. Stent placed
2 years ago and never removed.



[Series 2: stone full standard · axial · 0.86mm/px · z∈[-684,-244]mm · 13 of 98 slices shown, 15 images]
[im 5/98  soft-tissue]
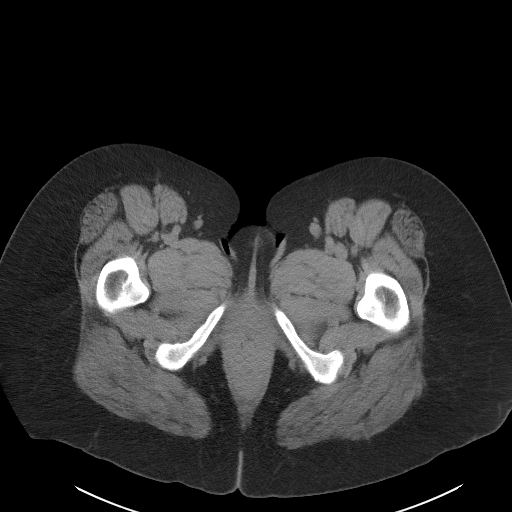
[im 5/98  bone]
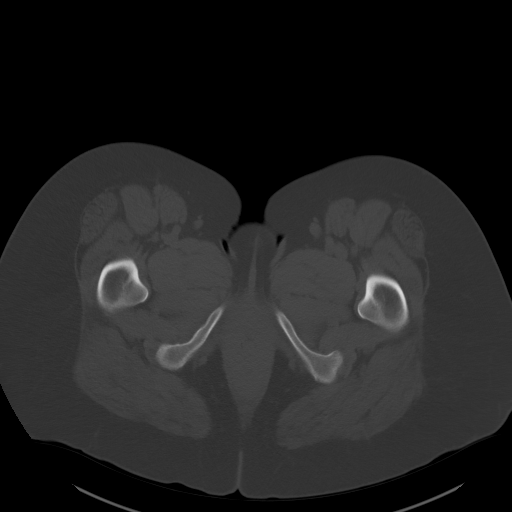
[im 14/98  soft-tissue]
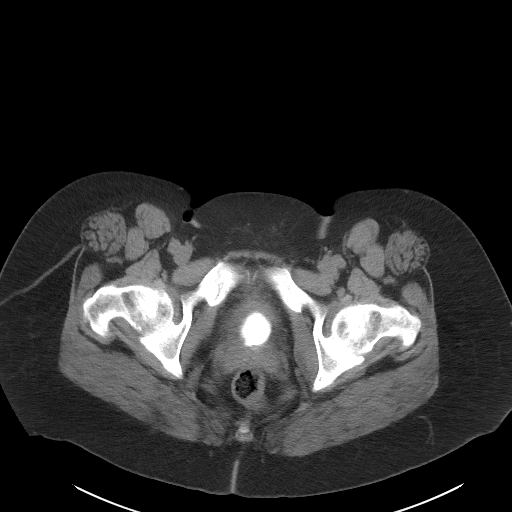
[im 19/98  soft-tissue]
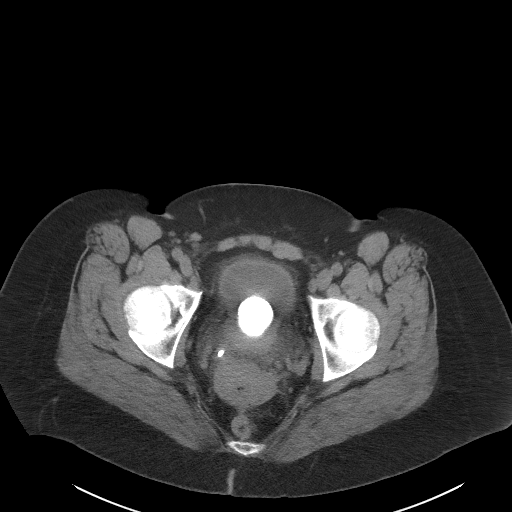
[im 28/98  soft-tissue]
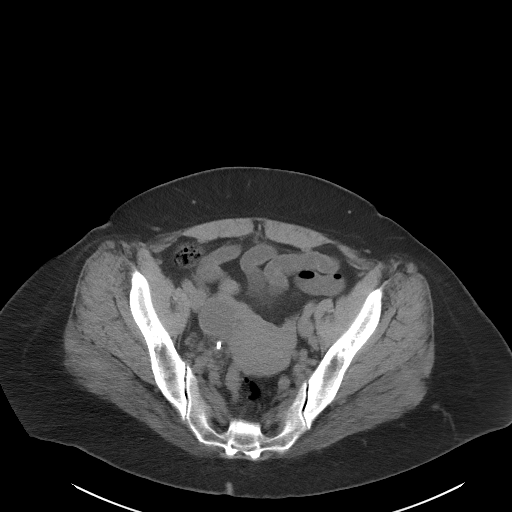
[im 33/98  soft-tissue]
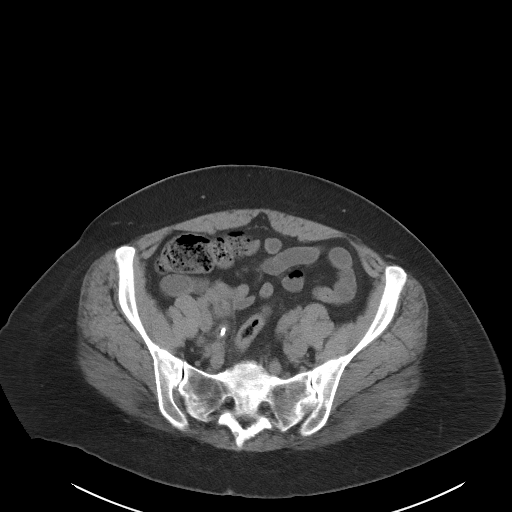
[im 42/98  soft-tissue]
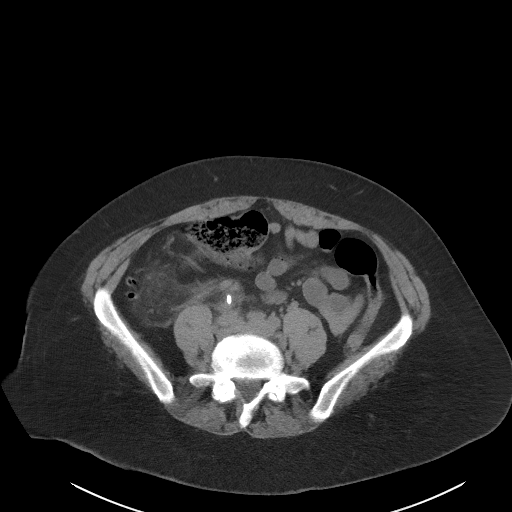
[im 51/98  soft-tissue]
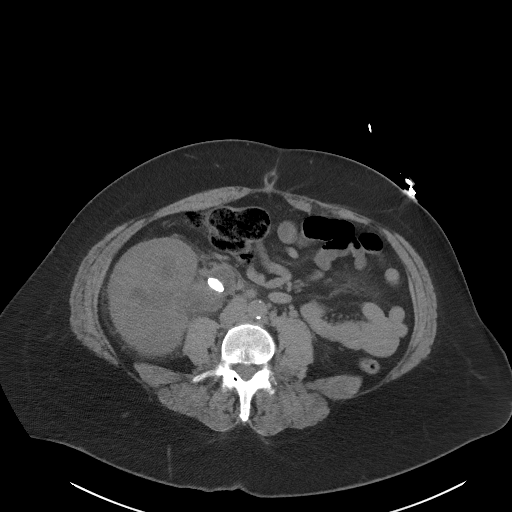
[im 56/98  soft-tissue]
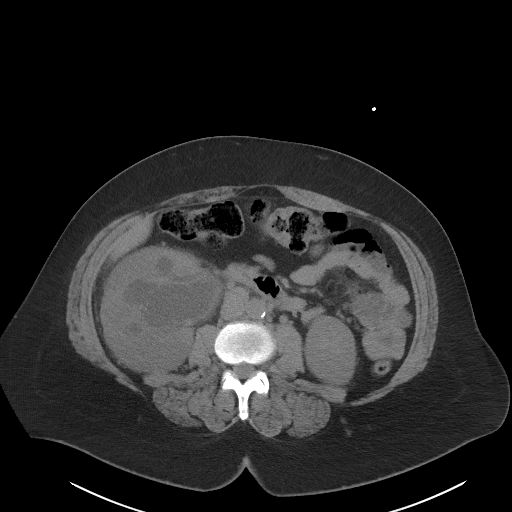
[im 65/98  soft-tissue]
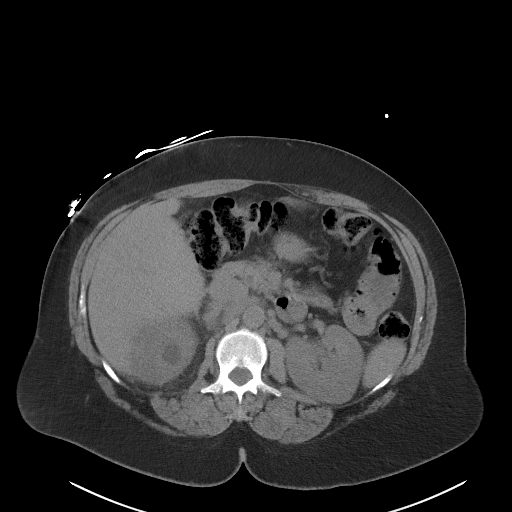
[im 65/98  bone]
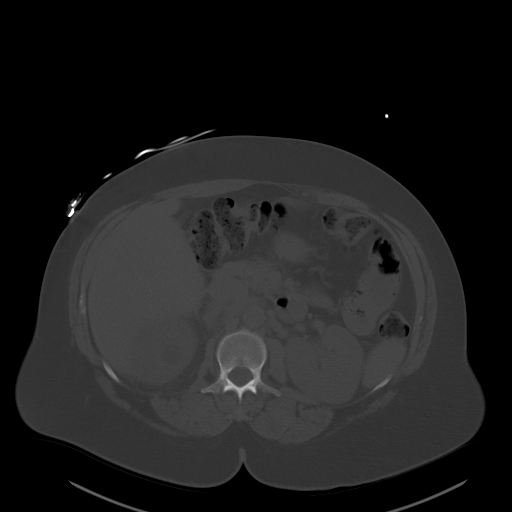
[im 70/98  soft-tissue]
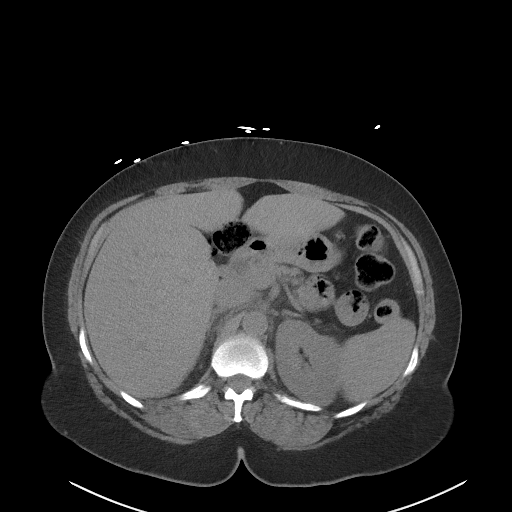
[im 79/98  soft-tissue]
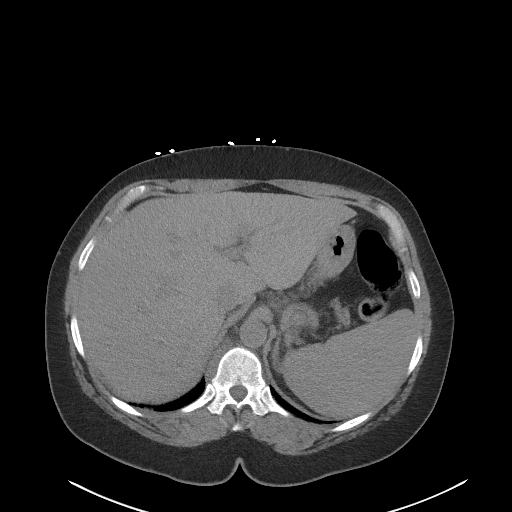
[im 84/98  soft-tissue]
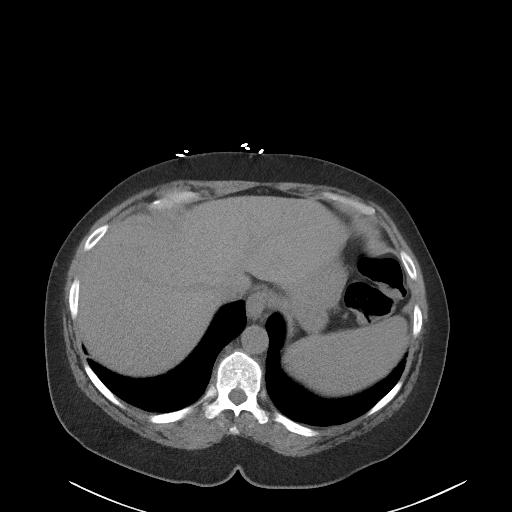
[im 93/98  soft-tissue]
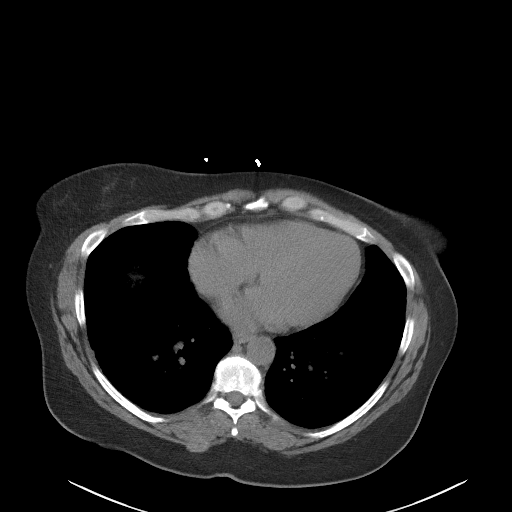

[Series 5: coronal · coronal · 0.80mm/px · 3 of 152 slices shown]
[im 51/152  soft-tissue]
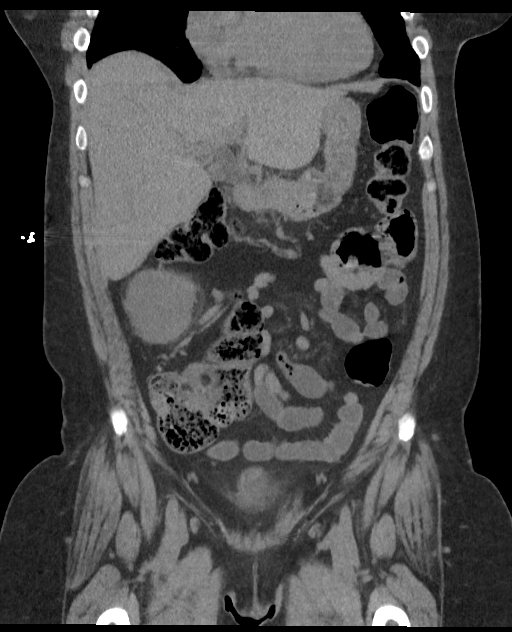
[im 68/152  soft-tissue]
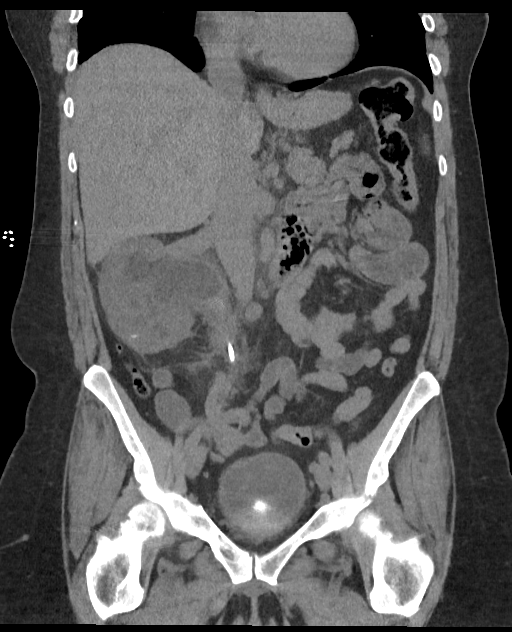
[im 84/152  soft-tissue]
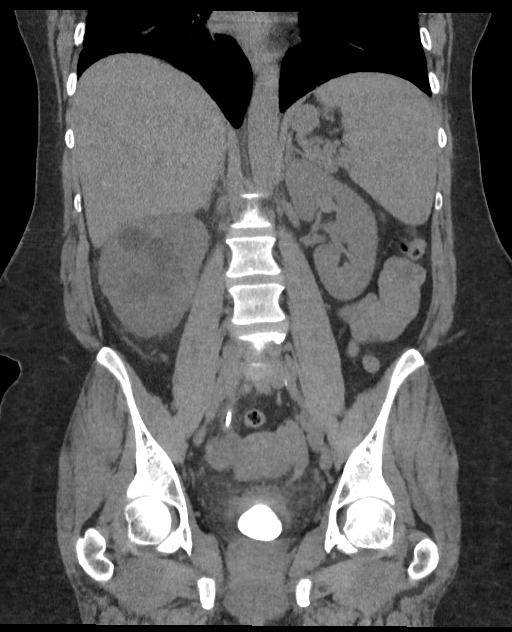

[16 of 46 positions shown; findings below may reference images not displayed]

FINDINGS: Lower chest:  No contributory findings.

Hepatobiliary: No focal liver abnormality.Cholecystectomy

Pancreas: Unremarkable.

Spleen: Unremarkable.

Adrenals/Urinary Tract: Negative adrenals. Right internal ureteral
stent, reportedly long-standing. The upper stent is encompassed by a
disc like calcification at the UPJ measuring 2 cm in diameter by 1
cm in thickness. At the level of L5-S1 there is an additional stone
adjacent to the stent measuring 6 mm. Around the lower retention
loop is a bladder calcification measuring 3.3 cm by 4.5 cm. No
bladder stone encompasses the stent which is folded multiple times
into a loop. Right hydronephrosis and upper hydroureter with
perinephric, Peri ureteric, and perivesicular fat stranding. Right
hydronephrosis with renal expansion and punctate calcifications at
the lower pole moieties. Punctate left renal calculus with otherwise
negative left kidney.

Stomach/Bowel:  No obstruction. No appendicitis.

Vascular/Lymphatic: No acute vascular abnormality. No mass or
adenopathy.

Reproductive:No pathologic findings.  Dominant follicle on the right

Other: No ascites or pneumoperitoneum.

Musculoskeletal: No acute abnormalities. Notable for age lumbar
spine degeneration. Based on the lowest ribs there is L5-S1
incomplete segmentation and L3-4 mild anterolisthesis.
IMPRESSION: 1. Right internal ureteral stent encrusted with calculi at the UPJ,
mid ureter, and especially the bladder where a 4.5 cm stone
encompasses the looped stent.
2. Right hydronephrosis and perinephric stranding. The pattern of
calyceal dilatation is worrisome for developing xanthogranulomatous
pyelonephritis, although no associated staghorn calculus.

## 2024-07-03 ENCOUNTER — Ambulatory Visit: Admitting: Physician Assistant
